# Patient Record
Sex: Male | Born: 1955 | Race: Black or African American | Hispanic: No | Smoking: Current every day smoker
Health system: Southern US, Community
[De-identification: ages and names within clinical notes are randomized; demographics above are authoritative.]

## PROBLEM LIST (undated history)

## (undated) DIAGNOSIS — I639 Cerebral infarction, unspecified: Secondary | ICD-10-CM

---

## 2001-08-05 ENCOUNTER — Emergency Department (HOSPITAL_COMMUNITY): Admission: EM | Admit: 2001-08-05 | Discharge: 2001-08-05 | Payer: Self-pay | Admitting: Emergency Medicine

## 2001-08-17 ENCOUNTER — Emergency Department (HOSPITAL_COMMUNITY): Admission: EM | Admit: 2001-08-17 | Discharge: 2001-08-17 | Payer: Self-pay | Admitting: Emergency Medicine

## 2005-01-13 ENCOUNTER — Emergency Department (HOSPITAL_COMMUNITY): Admission: EM | Admit: 2005-01-13 | Discharge: 2005-01-13 | Payer: Self-pay | Admitting: Emergency Medicine

## 2005-01-15 ENCOUNTER — Emergency Department (HOSPITAL_COMMUNITY): Admission: EM | Admit: 2005-01-15 | Discharge: 2005-01-15 | Payer: Self-pay | Admitting: Family Medicine

## 2010-11-10 ENCOUNTER — Inpatient Hospital Stay (HOSPITAL_COMMUNITY)
Admission: EM | Admit: 2010-11-10 | Discharge: 2010-11-19 | DRG: 066 | Disposition: A | Discharge status: Skilled Nursing Facility | Payer: MEDICAID | Attending: Family Medicine | Admitting: Family Medicine

## 2010-11-10 ENCOUNTER — Emergency Department (HOSPITAL_COMMUNITY): Payer: Self-pay

## 2010-11-10 DIAGNOSIS — M542 Cervicalgia: Secondary | ICD-10-CM | POA: Diagnosis present

## 2010-11-10 DIAGNOSIS — I6350 Cerebral infarction due to unspecified occlusion or stenosis of unspecified cerebral artery: Principal | ICD-10-CM | POA: Diagnosis present

## 2010-11-10 DIAGNOSIS — F17200 Nicotine dependence, unspecified, uncomplicated: Secondary | ICD-10-CM | POA: Diagnosis present

## 2010-11-10 LAB — COMPREHENSIVE METABOLIC PANEL
ALT: 46 U/L (ref 0–53)
AST: 31 U/L (ref 0–37)
Albumin: 3.5 g/dL (ref 3.5–5.2)
Alkaline Phosphatase: 101 U/L (ref 39–117)
BUN: 8 mg/dL (ref 6–23)
Potassium: 3.5 mEq/L (ref 3.5–5.1)
Sodium: 138 mEq/L (ref 135–145)
Total Protein: 6.6 g/dL (ref 6.0–8.3)

## 2010-11-10 LAB — CBC
HCT: 43.5 % (ref 39.0–52.0)
Hemoglobin: 15.3 g/dL (ref 13.0–17.0)
MCH: 32.3 pg (ref 26.0–34.0)
MCHC: 35.2 g/dL (ref 30.0–36.0)
RDW: 13.5 % (ref 11.5–15.5)

## 2010-11-10 LAB — RAPID URINE DRUG SCREEN, HOSP PERFORMED
Cocaine: NOT DETECTED
Opiates: NOT DETECTED
Tetrahydrocannabinol: NOT DETECTED

## 2010-11-10 LAB — URINALYSIS, ROUTINE W REFLEX MICROSCOPIC
Glucose, UA: NEGATIVE mg/dL
Hgb urine dipstick: NEGATIVE
Ketones, ur: 40 mg/dL — AB
pH: 6 (ref 5.0–8.0)

## 2010-11-10 LAB — DIFFERENTIAL
Eosinophils Relative: 5 % (ref 0–5)
Lymphocytes Relative: 28 % (ref 12–46)
Monocytes Absolute: 0.9 10*3/uL (ref 0.1–1.0)
Monocytes Relative: 12 % (ref 3–12)
Neutro Abs: 3.9 10*3/uL (ref 1.7–7.7)

## 2010-11-10 LAB — TROPONIN I: Troponin I: <0.3 ng/mL (ref <0.30)

## 2010-11-10 LAB — CK TOTAL AND CKMB (NOT AT ARMC)
CK, MB: 1.7 ng/mL (ref 0.3–4.0)
Relative Index: 1.1 (ref 0.0–2.5)

## 2010-11-11 ENCOUNTER — Inpatient Hospital Stay (HOSPITAL_COMMUNITY): Payer: Self-pay

## 2010-11-11 DIAGNOSIS — R42 Dizziness and giddiness: Secondary | ICD-10-CM

## 2010-11-11 DIAGNOSIS — I6350 Cerebral infarction due to unspecified occlusion or stenosis of unspecified cerebral artery: Secondary | ICD-10-CM

## 2010-11-11 LAB — GLUCOSE, CAPILLARY: Glucose-Capillary: 111 mg/dL — ABNORMAL HIGH (ref 70–99)

## 2010-11-11 LAB — LIPID PANEL
Cholesterol: 84 mg/dL (ref 0–200)
Triglycerides: 102 mg/dL (ref <150)
VLDL: 20 mg/dL (ref 0–40)

## 2010-11-11 LAB — HEMOGLOBIN A1C
Hgb A1c MFr Bld: 5.9 % — ABNORMAL HIGH (ref <5.7)
Mean Plasma Glucose: 123 mg/dL — ABNORMAL HIGH (ref <117)

## 2010-11-11 MED ORDER — GADOBENATE DIMEGLUMINE 529 MG/ML IV SOLN
17.0000 mL | Freq: Once | INTRAVENOUS | Status: AC
  Administered 2010-11-11: 17 mL via INTRAVENOUS

## 2010-11-12 DIAGNOSIS — I6330 Cerebral infarction due to thrombosis of unspecified cerebral artery: Secondary | ICD-10-CM

## 2010-11-12 LAB — GLUCOSE, CAPILLARY: Glucose-Capillary: 75 mg/dL (ref 70–99)

## 2010-11-13 DIAGNOSIS — I6789 Other cerebrovascular disease: Secondary | ICD-10-CM

## 2010-11-13 LAB — GLUCOSE, CAPILLARY
Glucose-Capillary: 77 mg/dL (ref 70–99)
Glucose-Capillary: 150 mg/dL — ABNORMAL HIGH (ref 70–99)

## 2010-11-13 LAB — PROTEIN S ACTIVITY: Protein S Activity: 98 % (ref 69–129)

## 2010-11-13 LAB — LUPUS ANTICOAGULANT PANEL: Lupus Anticoagulant: NOT DETECTED

## 2010-11-13 LAB — PROTEIN C, TOTAL: Protein C, Total: 63 % — ABNORMAL LOW (ref 72–160)

## 2010-11-13 LAB — PROTEIN S, TOTAL: Protein S Ag, Total: 107 % (ref 60–150)

## 2010-11-13 LAB — SICKLE CELL SCREEN: Sickle Cell Screen: NEGATIVE

## 2010-11-13 LAB — PROTEIN C ACTIVITY: Protein C Activity: 105 % (ref 75–133)

## 2010-11-13 LAB — SEDIMENTATION RATE: Sed Rate: 4 mm/hr (ref 0–16)

## 2010-11-13 LAB — RPR: RPR Ser Ql: NONREACTIVE

## 2010-11-14 LAB — CBC
HCT: 43.6 % (ref 39.0–52.0)
MCHC: 34.2 g/dL (ref 30.0–36.0)
MCV: 92.2 fL (ref 78.0–100.0)
RDW: 13.1 % (ref 11.5–15.5)

## 2010-11-14 LAB — BASIC METABOLIC PANEL
BUN: 11 mg/dL (ref 6–23)
CO2: 25 mEq/L (ref 19–32)
Calcium: 9.7 mg/dL (ref 8.4–10.5)
GFR calc non Af Amer: >60 mL/min (ref >60)
Glucose, Bld: 81 mg/dL (ref 70–99)

## 2010-11-14 LAB — CARDIOLIPIN ANTIBODIES, IGG, IGM, IGA
Anticardiolipin IgA: 5 APL U/mL — ABNORMAL LOW (ref <22)
Anticardiolipin IgM: 6 MPL U/mL — ABNORMAL LOW (ref <11)

## 2010-11-14 LAB — GLUCOSE, CAPILLARY: Glucose-Capillary: 98 mg/dL (ref 70–99)

## 2010-11-14 LAB — BETA-2-GLYCOPROTEIN I ABS, IGG/M/A: Beta-2-Glycoprotein I IgA: 6 A Units (ref <20)

## 2010-11-15 LAB — GLUCOSE, CAPILLARY
Glucose-Capillary: 107 mg/dL — ABNORMAL HIGH (ref 70–99)
Glucose-Capillary: 108 mg/dL — ABNORMAL HIGH (ref 70–99)

## 2010-11-16 LAB — GLUCOSE, CAPILLARY
Glucose-Capillary: 108 mg/dL — ABNORMAL HIGH (ref 70–99)
Glucose-Capillary: 92 mg/dL (ref 70–99)
Glucose-Capillary: 117 mg/dL — ABNORMAL HIGH (ref 70–99)

## 2010-11-17 LAB — GLUCOSE, CAPILLARY

## 2010-11-17 LAB — PROTHROMBIN GENE MUTATION

## 2010-11-27 NOTE — Consult Note (Signed)
[name], [name]               ACCOUNT NO.:  [id number]  MEDICAL RECORD NO.:  [id number]  LOCATION:  [facility]                         FACILITY:  [facility]  PHYSICIAN:  Dr. Thad Ranger           DATE OF BIRTH:  [birth year]  DATE OF CONSULTATION: DATE OF DISCHARGE:                                CONSULTATION   REASON FOR CONSULTATION:  Stroke.  HISTORY OF PRESENT ILLNESS:  This is a 55 year old male with no past medical history as he sees no medical doctor at the current time.  The patient at present time is going back to school for criminal justice degree.  The patient does admit to smoking use of alcohol and in the past use of illicit drugs.  He states that he went out with his friends on Saturday night in which she did drink a significant amount.  After returning home, he recalls following but does not recall whether or not it was secondary to weakness.  He awoke on Sunday morning on the floor of his living room and noted right-sided weakness of his arm and leg. He states that he dragged himself to the other room where he got comfortable on the couch and fell and slept Sunday away.  He states he did not eat any food during Sunday.  Upon waking on Monday, he thought at that time that he had a hangover.  On Monday when awakening and continued to have right-sided weakness and he thought it is best for him to go to the hospital to get checked out.  Upon admission, the patient was noted to have right-sided weakness and slow thought process.  For that reason, the patient underwent a CT scan which was negative. However, MRI of brain showed strokes in multiple vascular territories bilaterally.  Neurology was consulted to further evaluate the patient and give recommendations per stroke protocol.  PAST MEDICAL HISTORY:  Negative.  MEDICATIONS:  The patient is on no medications at home.  ALLERGIES:  No known drug allergies.  FAMILY HISTORY:  Diabetes and stroke along with myocardial  infarct.  SOCIAL HISTORY:  The patient does smoke, drink and does have history of illicit drug use.  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  REVIEW OF SYSTEMS:  Positive for right-sided weakness, decreased slow mentation and decreased sensation along the plantar surfaces of his feet, most notably in the left.  PHYSICAL EXAMINATION:  VITAL SIGNS:  Blood pressure is 123/79, pulse 75, respiration 18, temperature 97.5. GENERAL:  The patient is alert and oriented x3.  He is very slow in mentation and states he is slower to speak than usual.  He does carry out 2- and 3-step commands without any difficulty. HEENT:  Pupils are equal, round, reactive to light and accommodating, conjugate extraocular movements are intact.  Visual fields grossly intact.  Face does show a left facial droop.  His tongue is midline. Uvula is midline.  He shows no dysarthria or dysphasia, but slow to initiate his verbal output.  Facial sensation V1-V3 is full bilaterally. Shoulder shrug head turn within normal limits. COORDINATION:  Finger-to-nose, heel-to-shin were smooth.  Fine movements smooth. MOTOR:  The patient shows 4/5 strength throughout with the exception of decreased strength along his right shoulder abduction, forward flexion of shoulder decreased strength in his right triceps, decreased strength in his right wrist extension and right grip.  The deep tendon reflexes were 2+ throughout downgoing toes bilaterally.  The patient does show a positive drift with his right upper extremity sensation since the patient is stated to be intact throughout to pinprick and light touch. However, he does state he feels as though there is some decreased sensation,which is normally in the left plantar surface of his foot. PULMONARY:  Clear to auscultation bilaterally. CARDIOVASCULAR:  Normal sinus rhythm. NECK:  Negative for bruits and supple.  LABORATORY STUDIES:  A1c is 5.9.  Sodium 138,  potassium 3.5, chloride 103, CO2 is 25, BUN and creatinine 0.74, glucose 88.  White blood cell count 7.0, platelets 195, hemoglobin 15.3, hematocrit 43.5, triglycerides 102, cholesterol 84, HDL 46 LDL 18.  IMAGING DATA:  MRI of brain shows multiple acute subacute infarcts in the posterior left temporal lobe left occipital lobe, left caudate, left insular cortex, left frontal lobe, left parietal lobe along with right parietal lobe.  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  Carotid Doppler shows no ICA stenosis.  A 2-D echo shows no abnormalities.  ASSESSMENT:  This is a pleasant 55 year old male with sudden onset of right-sided weakness and new acute/subacute strokes seen in multiple vascular territories (most notably in the left PCA, left MCA, and right PCA) on imaging.  At this time, we would recommend further workup for acute infarct that would address some possible etiologies in a patient of this age (without multiple risk factors considered to be fairly young).  PLAN:  1. Hypercoagulable panel, sickle-cell panel. 2. TEE to look for cardiac abnormalities, not seen on 2-D echo3. Continue aspirin. 4. Physical therapy and occupational therapy.  I have discussed these signs with Dr. Thad Ranger.  She agrees to the above- mentioned history.     Adam Morn, [name]   ______________________________ Dr. Thad Ranger    DS/MEDQ  D:  11/12/2010  T:  11/13/2010  Job:  [id number]  Electronically Signed by Adam Morn [name] on 11/17/2010 01:17:59 PM Electronically Signed by Thana Farr MD on 11/27/2010 10:24:49 AM

## 2010-11-27 NOTE — Discharge Summary (Signed)
[name], [name]               ACCOUNT NO.:  [id number]  MEDICAL RECORD NO.:  [id number]  LOCATION:  [facility]                         FACILITY:  [facility]  PHYSICIAN:  Pearlean Brownie, M.D.DATE OF BIRTH:  Jun 11, 1955  DATE OF ADMISSION:  11/10/2010 DATE OF DISCHARGE:  11/19/2010                              DISCHARGE SUMMARY   DISCHARGE DIAGNOSES: 1. Multiple bilateral cerebrovascular accident. 2. Tobacco dependence.  DISCHARGE MEDICATIONS: 1. Aspirin 325 mg p.o. daily. 2. Nicotine patch 21 mg q.24 hours transdermal daily.  LABS AND STUDIES: 1. Hypercoagulability panel negative. 2. A1c 5.9. 3. Lipid profile; cholesterol, LDL 18, HDL 46, triglycerides 102. 4. Sickle screen negative. 5. ESR 4. 6. RPR nonreactive. 7. ANA negative.  RADIOLOGY: 1. CT head on admission negative. 2. MR brain showed multiple acute, subacute infarct involving portions     of the posterior left temporal lobe, left occipital lobe, left     caudate head, left subinsular region of left frontal lobe, left     parietal lobe, and medial aspect of right parietal lobe with     findings most notable in the left frontal parietal lobe.  An MRA     showing left vertebral artery not visualized with indeterminate     cause of occlusion, is possibly related to atherosclerotic type     change, but dissection not excluded and nonvisualization of left     PICA. 3. Echocardiogram showing EF within normal limits and no wall motion     abnormalities.  EF 55-60%. 4. Transesophageal echo on November 13, 2010, showing no vegetations or     evidence of thrombus and normal wall motion. 5. Carotid Dopplers showing no significant carotid artery stenosis     extracranially with patent vertebrals with antegrade flow.  BRIEF HOSPITAL COURSE:  This is a 55 year old man with history of tobacco abuse who presents with multiple acute/subacute infarcts.  1. CVAs.  The patient had no     significant past medical history and was  admitted for right sided weakness and speech difficulty. MRI showed the presence of multiple bilateral CVAs.     This was concerning for source of embolic stroke, however,     none was elucidated.  Only notable risk factor is tobacco abuse.     The patient was started on aspirin daily and his symptoms were     monitored.  He has some 4/5 right upper extremity weakness and mild     gait instability and slowing of speech, however, his motor exam was     otherwise grossly intact.  The patient had some improvement in his     symptoms prior to discharge.  The patient was counseled regarding     smoking cessation and will follow up with neurologist in 1-2     months.  He will be discharged to skilled nursing facility for     inpatient physical and occupational therapy. 2. Tobacco abuse.  The patient was started on nicotine patch and     counseled extensively regarding cessation.  FOLLOWUP APPOINTMENTS:  The patient is to follow up with neurologist in 1-2 months for transcranial dopplers and outpatient bubble study.  DISCHARGE CONDITION:  The patient  was discharged to SNF in stable medical condition.    ______________________________ Lloyd Huger, MD   ______________________________ Pearlean Brownie, M.D.    JK/MEDQ  D:  11/14/2010  T:  11/15/2010  Job:  [id number]  Electronically Signed by Lloyd Huger MD on 11/19/2010 03:02:16 PM Electronically Signed by Pearlean Brownie M.D. on 11/27/2010 10:23:13 AM
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
## 2011-05-03 ENCOUNTER — Emergency Department (HOSPITAL_COMMUNITY)
Admission: EM | Admit: 2011-05-03 | Discharge: 2011-05-03 | Disposition: A | Discharge status: Home / Self Care | Payer: Self-pay | Attending: Emergency Medicine | Admitting: Emergency Medicine

## 2011-05-03 ENCOUNTER — Encounter: Payer: Self-pay | Admitting: *Deleted

## 2011-05-03 DIAGNOSIS — R05 Cough: Secondary | ICD-10-CM | POA: Insufficient documentation

## 2011-05-03 DIAGNOSIS — R059 Cough, unspecified: Secondary | ICD-10-CM | POA: Insufficient documentation

## 2011-05-03 DIAGNOSIS — R5381 Other malaise: Secondary | ICD-10-CM | POA: Insufficient documentation

## 2011-05-03 DIAGNOSIS — J3489 Other specified disorders of nose and nasal sinuses: Secondary | ICD-10-CM | POA: Insufficient documentation

## 2011-05-03 DIAGNOSIS — R220 Localized swelling, mass and lump, head: Secondary | ICD-10-CM | POA: Insufficient documentation

## 2011-05-03 DIAGNOSIS — F17200 Nicotine dependence, unspecified, uncomplicated: Secondary | ICD-10-CM | POA: Insufficient documentation

## 2011-05-03 DIAGNOSIS — H00019 Hordeolum externum unspecified eye, unspecified eyelid: Secondary | ICD-10-CM | POA: Insufficient documentation

## 2011-05-03 DIAGNOSIS — J069 Acute upper respiratory infection, unspecified: Secondary | ICD-10-CM | POA: Insufficient documentation

## 2011-05-03 HISTORY — DX: Cerebral infarction, unspecified: I63.9

## 2011-05-03 MED ORDER — IBUPROFEN 800 MG PO TABS: 800.0000 mg | ORAL_TABLET | Freq: Three times a day (TID) | ORAL | Status: AC | PRN

## 2011-05-03 MED ORDER — ERYTHROMYCIN 5 MG/GM OP OINT: TOPICAL_OINTMENT | OPHTHALMIC | Status: AC

## 2011-05-03 MED ORDER — GUAIFENESIN ER 1200 MG PO TB12: 1.0000 | ORAL_TABLET | Freq: Two times a day (BID) | ORAL | Status: DC

## 2011-05-03 MED ORDER — ACETAMINOPHEN-CODEINE 120-12 MG/5ML PO SOLN: 10.0000 mL | ORAL | Status: AC | PRN

## 2011-05-03 NOTE — ED Notes (Signed)
States he has been taking otc meds for a 'cold' and is feeling 'weird'. Hx of stroke and worried about this. Mae x4 freely. No neuro deficits noted

## 2011-05-03 NOTE — ED Provider Notes (Signed)
History     CSN: [id number] Arrival date & time: 05/03/2011  9:54 AM   First MD Initiated Contact with Patient 05/03/11 1040      Chief Complaint  Patient presents with  . Fatigue    (Consider location/radiation/quality/duration/timing/severity/associated sxs/prior treatment) The history is provided by the patient.  55 y.o. male presents to the emergency department c/o a stye on his L eye, cold ssx and discomfort in his R arm.    Pt states the stye has been present x 2 days.  No drainage or pain at the site, no changes in vision.  Pt c/o cold ssx x 4 days with cough, congestion, rhinorrhea, chills, sinus pressure.  Denies headache, chest pain, shortness of breath, abdominal pain, nausea, vomiting, diarrhea and otalgia.  Unknown if any fever.  Pt treating symptoms with ASA and OTC cough and cold medicine.    Pt also c/o discomfort in his R upper arm.  PMHx CVA in June 2012 with R arm, L leg and speech deficits.  Pt states his only ssx is a cramp like sensation on the lateral aspect of the upper arm.  Pt denies paresthesias, numbness, weakness or loss of function.  Pt denies speech or vision changes, arm or leg weakness.    Past Medical History  Diagnosis Date  . Stroke     History reviewed. No pertinent past surgical history.  History reviewed. No pertinent family history.  History  Substance Use Topics  . Smoking status: Current Everyday Smoker    Types: Cigarettes  . Smokeless tobacco: Not on file  . Alcohol Use:       Review of Systems All pertinent positives and negatives in the history of present illness Allergies  Review of patient's allergies indicates no known allergies.  Home Medications   Current Outpatient Rx  Name Route Sig Dispense Refill  . ASPIRIN 325 MG PO TABS Oral Take 325 mg by mouth daily.      Marland Kitchen OVER THE COUNTER MEDICATION Oral Take 10 mLs by mouth every 4 (four) hours.        BP 139/91  Pulse 100  Temp(Src) 98.8 F (37.1 C) (Oral)  Resp  14  SpO2 96%  Physical Exam  Constitutional: He is oriented to person, place, and time. He appears well-developed and well-nourished.  HENT:  Head: Normocephalic and atraumatic.  Right Ear: Tympanic membrane, external ear and ear canal normal.  Left Ear: Tympanic membrane, external ear and ear canal normal.  Nose: Mucosal edema present.  Mouth/Throat: Uvula is midline, oropharynx is clear and moist and mucous membranes are normal.  Eyes: Conjunctivae and EOM are normal. Pupils are equal, round, and reactive to light. Right eye exhibits no discharge and no exudate. Left eye exhibits hordeolum. Left eye exhibits no discharge and no exudate. No scleral icterus.    Neck: Normal range of motion. Neck supple.  Cardiovascular: Normal rate, regular rhythm, normal heart sounds and intact distal pulses.   Pulmonary/Chest: Effort normal and breath sounds normal. No respiratory distress. He has no wheezes. He has no rales. He exhibits no tenderness.  Abdominal: Soft. Bowel sounds are normal. He exhibits no distension. There is no tenderness.  Musculoskeletal:       Right shoulder: He exhibits normal range of motion, no tenderness, no swelling, no crepitus and normal strength.       Right elbow: He exhibits normal range of motion and no deformity. no tenderness found.       Right wrist: He exhibits  normal range of motion, no tenderness, no crepitus and no deformity.       Arms: Lymphadenopathy:    He has no cervical adenopathy.  Neurological: He is alert and oriented to person, place, and time. He has normal strength and normal reflexes. He displays normal reflexes. No cranial nerve deficit or sensory deficit. He exhibits normal muscle tone. Coordination normal. GCS eye subscore is 4. GCS verbal subscore is 5. GCS motor subscore is 6.  Skin: Skin is warm and dry.  Psychiatric: He has a normal mood and affect. His behavior is normal. Judgment and thought content normal.    ED Course  Procedures  (including critical care time)       The patient has what appears to be an upper respiratory tract infection based on his history of present illness and physical exam.  He also has what appears to be a small stye noted on exam.  The patient be treated for these and advised to return here as needed for any worsening in his condition.  Told to follow up with his Dr. for recheck.   MDM          Carlyle Dolly, PA-C 05/03/11 1200

## 2011-05-03 NOTE — ED Provider Notes (Signed)
Medical screening examination/treatment/procedure(s) were performed by non-physician practitioner and as supervising physician I was immediately available for consultation/collaboration. Devoria Albe, MD, Armando Gang   Ward Givens, MD 05/03/11 [phone]

## 2014-05-02 ENCOUNTER — Emergency Department (HOSPITAL_COMMUNITY)
Admission: EM | Admit: 2014-05-02 | Discharge: 2014-05-02 | Disposition: A | Discharge status: Home / Self Care | Payer: Self-pay | Attending: Emergency Medicine | Admitting: Emergency Medicine

## 2014-05-02 ENCOUNTER — Encounter (HOSPITAL_COMMUNITY): Payer: Self-pay | Admitting: Family Medicine

## 2014-05-02 DIAGNOSIS — Z8673 Personal history of transient ischemic attack (TIA), and cerebral infarction without residual deficits: Secondary | ICD-10-CM | POA: Insufficient documentation

## 2014-05-02 DIAGNOSIS — Z7982 Long term (current) use of aspirin: Secondary | ICD-10-CM | POA: Insufficient documentation

## 2014-05-02 DIAGNOSIS — J029 Acute pharyngitis, unspecified: Secondary | ICD-10-CM | POA: Insufficient documentation

## 2014-05-02 DIAGNOSIS — Z720 Tobacco use: Secondary | ICD-10-CM | POA: Insufficient documentation

## 2014-05-02 LAB — RAPID STREP SCREEN (MED CTR MEBANE ONLY): Streptococcus, Group A Screen (Direct): NEGATIVE

## 2014-05-02 NOTE — ED Provider Notes (Signed)
CSN: [id number]     Arrival date & time 05/02/14  1350 History  This chart was scribed for non-physician practitioner, Montine Circle, PA-C working with Fredia Sorrow, MD by Frederich Balding, ED scribe. This patient was seen in room TR06C/TR06C and the patient's care was started at 2:17 PM.   Chief Complaint  Patient presents with  . Sore Throat   The history is provided by the patient. No language interpreter was used.    HPI Comments: [name] is a 58 y.o. male who presents to the Emergency Department complaining of sore throat that started 3 days ago. Also reports nonproductive cough. States he had a sore throat about 1.5 weeks ago that went away on its own. Pt has used cough drops and taken aspirin with no relief. Denies fever.   Past Medical History  Diagnosis Date  . Stroke    History reviewed. No pertinent past surgical history. History reviewed. No pertinent family history. History  Substance Use Topics  . Smoking status: Current Every Day Smoker    Types: Cigarettes  . Smokeless tobacco: Not on file  . Alcohol Use: Not on file    Review of Systems  Constitutional: Negative for fever.  HENT: Positive for sore throat.   Eyes: Negative for redness.  Respiratory: Positive for cough.   Cardiovascular: Negative for chest pain.  Gastrointestinal: Negative for abdominal distention.  Musculoskeletal: Negative for gait problem.  Skin: Negative for rash.  Neurological: Negative for speech difficulty.  Psychiatric/Behavioral: Negative for confusion.   Allergies  Review of patient's allergies indicates no known allergies.  Home Medications   Prior to Admission medications   Medication Sig Start Date End Date Taking? Authorizing Provider  aspirin 325 MG tablet Take 325 mg by mouth daily.      Historical Provider, MD  Guaifenesin 1200 MG TB12 Take 1 tablet (1,200 mg total) by mouth 2 (two) times daily. 05/03/11   Resa Miner Lawyer, PA-C  OVER THE COUNTER MEDICATION  Take 10 mLs by mouth every 4 (four) hours.      Historical Provider, MD   BP 159/101 mmHg  Pulse 102  Temp(Src) 98 F (36.7 C)  Resp 18  SpO2 98%   Physical Exam  Constitutional: He is oriented to person, place, and time. He appears well-developed. No distress.  HENT:  Head: Normocephalic and atraumatic.  Oropharynx is moderately erythematous. No tonsillar exudates or abscess. Uvula is midline. Airway is intact.  Eyes: Conjunctivae and EOM are normal.  Cardiovascular: Normal rate and regular rhythm.   Pulmonary/Chest: Effort normal. No stridor. No respiratory distress.  Abdominal: He exhibits no distension.  Musculoskeletal: He exhibits no edema.  Neurological: He is alert and oriented to person, place, and time.  Skin: Skin is warm and dry.  Psychiatric: He has a normal mood and affect.  Nursing note and vitals reviewed.   ED Course  Procedures (including critical care time)  DIAGNOSTIC STUDIES: Oxygen Saturation is 98% on RA, normal by my interpretation.    COORDINATION OF CARE: 2:18 PM-Discussed treatment plan which includes rapid strep test with pt at bedside and pt agreed to plan.   Labs Review Results for orders placed or performed during the hospital encounter of 05/02/14  Rapid strep screen   (If patient has fever and/or without cough or runny nose)  Result Value Ref Range   Streptococcus, Group A Screen (Direct) NEGATIVE NEGATIVE   No results found.   Imaging Review No results found.   EKG Interpretation None  MDM   Final diagnoses:  Pharyngitis    Pt afebrile without tonsillar exudate, negative strep. Presents with mild cervical lymphadenopathy, & dysphagia; diagnosis of viral pharyngitis. No abx indicated. DC w symptomatic tx for pain  Pt does not appear dehydrated, but did discuss importance of water rehydration. Presentation non concerning for PTA or infxn spread to soft tissue. No trismus or uvula deviation. Specific return precautions  discussed. Pt able to drink water in ED without difficulty with intact air way. Recommended PCP follow up.   I personally performed the services described in this documentation, which was scribed in my presence. The recorded information has been reviewed and is accurate.  Montine Circle, PA-C 05/02/14 [place], MD 05/03/14 [phone]

## 2014-05-02 NOTE — Discharge Instructions (Signed)

## 2014-05-02 NOTE — ED Notes (Signed)
Per pt sts 3 days of sore throat and cold like symptoms.

## 2014-05-04 LAB — CULTURE, GROUP A STREP

## 2014-11-22 ENCOUNTER — Encounter (HOSPITAL_COMMUNITY): Payer: Self-pay | Admitting: Emergency Medicine

## 2014-11-22 ENCOUNTER — Emergency Department (HOSPITAL_COMMUNITY)
Admission: EM | Admit: 2014-11-22 | Discharge: 2014-11-22 | Disposition: A | Discharge status: Home / Self Care | Payer: Self-pay | Attending: Emergency Medicine | Admitting: Emergency Medicine

## 2014-11-22 DIAGNOSIS — M25551 Pain in right hip: Secondary | ICD-10-CM | POA: Insufficient documentation

## 2014-11-22 DIAGNOSIS — Z8673 Personal history of transient ischemic attack (TIA), and cerebral infarction without residual deficits: Secondary | ICD-10-CM | POA: Insufficient documentation

## 2014-11-22 DIAGNOSIS — Z79899 Other long term (current) drug therapy: Secondary | ICD-10-CM | POA: Insufficient documentation

## 2014-11-22 DIAGNOSIS — Z792 Long term (current) use of antibiotics: Secondary | ICD-10-CM | POA: Insufficient documentation

## 2014-11-22 DIAGNOSIS — Z7982 Long term (current) use of aspirin: Secondary | ICD-10-CM | POA: Insufficient documentation

## 2014-11-22 MED ORDER — CYCLOBENZAPRINE HCL 5 MG PO TABS: 5.0000 mg | ORAL_TABLET | Freq: Three times a day (TID) | ORAL | Status: DC | PRN

## 2014-11-22 NOTE — ED Provider Notes (Signed)
CSN: [id number]     Arrival date & time 11/22/14  1850 History  This chart was scribed for non-physician practitioner, Okey Regal, PA-C working with Lacretia Leigh, MD, by Chester Holstein, ED Scribe. This patient was seen in room TR06C/TR06C and the patient's care was started at 7:13 PM.     Chief Complaint  Patient presents with  . Back Pain     The history is provided by the patient. No language interpreter was used.   HPI Comments: [name] is a 59 y.o. male with PMHx of CVA with some left foot and right arm deficiets who presents to the Emergency Department complaining of constant lower right back pain with onset 2 weeks ago. Pt reports 3-4 months ago he hurt his back while attempting to do a split with pain resolution occuring a few days later after several days of rest. He states the pain returned several weeks ago. He states this exacerbation is not as severe as before but is in the same area as before. He notes the pain radiates to upper left leg. He is able to ambulate. He has tried ASA and EtOH for relief. He takes ASA daily. Pt denies h/o IVDA. He has not seen a specialist or PCP for this pain. He denies any new numbness, tingling, loss of strength, bowel or bladder incontinence, difficulty urinating, saddle anesthesia, and fever.  Past Medical History  Diagnosis Date  . Stroke    History reviewed. No pertinent past surgical history. No family history on file. History  Substance Use Topics  . Smoking status: Current Every Day Smoker    Types: Cigarettes  . Smokeless tobacco: Not on file  . Alcohol Use: Yes    Review of Systems  Musculoskeletal: Positive for back pain.  All other systems reviewed and are negative.    Allergies  Review of patient's allergies indicates no known allergies.  Home Medications   Prior to Admission medications   Medication Sig Start Date End Date Taking? Authorizing Provider  aspirin 325 MG tablet Take 325 mg by mouth daily.       Historical Provider, MD  cyclobenzaprine (FLEXERIL) 5 MG tablet Take 1 tablet (5 mg total) by mouth 3 (three) times daily as needed for muscle spasms. 11/22/14   Okey Regal, PA-C  Guaifenesin 1200 MG TB12 Take 1 tablet (1,200 mg total) by mouth 2 (two) times daily. 05/03/11   Christopher Lawyer, PA-C  OVER THE COUNTER MEDICATION Take 10 mLs by mouth every 4 (four) hours.      Historical Provider, MD   BP 150/94 mmHg  Pulse 104  Temp(Src) 97.9 F (36.6 C) (Oral)  Resp 18  Ht 5\' 7"  (1.702 m)  Wt 209 lb 9 oz (95.057 kg)  BMI 32.81 kg/m2  SpO2 96% Physical Exam  Constitutional: He is oriented to person, place, and time. He appears well-developed and well-nourished.  HENT:  Head: Normocephalic.  Eyes: Conjunctivae are normal.  Neck: Normal range of motion. Neck supple.  Pulmonary/Chest: Effort normal.  Musculoskeletal: Normal range of motion.       Cervical back: He exhibits no bony tenderness.       Thoracic back: He exhibits no bony tenderness.       Lumbar back: He exhibits no bony tenderness.  Tenderness to superior aspect of gluteus to the right No deformities or step-offs noted Positive pain with flexion and internal rotation of right femur. Straight leg negative. Ambulates without difficulty  Neurological: He is alert and oriented  to person, place, and time.  Distal sensation grossly intact, perfusion intact, strength 5 out of 5 in all major muscle groups.  Skin: Skin is warm and dry.  Psychiatric: He has a normal mood and affect. His behavior is normal.  Nursing note and vitals reviewed.   ED Course  Procedures (including critical care time) DIAGNOSTIC STUDIES: Oxygen Saturation is 96% on room air, normal by my interpretation.    COORDINATION OF CARE: 7:44 PM Discussed treatment plan with patient at beside, the patient agrees with the plan and has no further questions at this time.   Labs Review Labs Reviewed - No data to display  ImagPatient presents withing  Review No results found.   EKG Interpretation None      MDM   Final diagnoses:  Hip pain, right   Patient presents with likely hip pain after attempting to do the splits. Patient reports symptoms resolved, but have been reoccurring. Patient reports he is able to ambulate, with some pain. Patient denies any distal neurological, strength, profusion deficits. No red flags for back pain. Patient was given contact information for case management, and [place] wellness to follow-up for access to insurance. Patient was given symptomatic treatment, follow-up information if symptoms continue to persist. Strict return precautions given. No further questions or concerns at time of discharge.  I personally performed the services described in this documentation, which was scribed in my presence. The recorded information has been reviewed and is accurate.    Okey Regal, PA-C 11/23/14 [place], MD 11/26/14 1044

## 2014-11-22 NOTE — ED Notes (Signed)
Pt st's he injured his lower back dancing approx 2 months ago.  St's after a few days of rest it was better.  Pt st's lower back started hurting again 2 weeks ago and will not go away

## 2014-11-22 NOTE — Discharge Instructions (Signed)
Please contact Mariann Laster at [phone] for help with insurance and appointment at [facility] and wellness. Monitor for new or worsening signs or symptoms return immediately if any present.

## 2014-12-21 ENCOUNTER — Ambulatory Visit: Payer: Self-pay

## 2016-01-02 ENCOUNTER — Emergency Department (HOSPITAL_COMMUNITY)
Admission: EM | Admit: 2016-01-02 | Discharge: 2016-01-02 | Disposition: A | Discharge status: Home / Self Care | Payer: Self-pay | Attending: Emergency Medicine | Admitting: Emergency Medicine

## 2016-01-02 ENCOUNTER — Encounter (HOSPITAL_COMMUNITY): Payer: Self-pay | Admitting: Emergency Medicine

## 2016-01-02 DIAGNOSIS — E860 Dehydration: Secondary | ICD-10-CM

## 2016-01-02 DIAGNOSIS — Z79899 Other long term (current) drug therapy: Secondary | ICD-10-CM | POA: Insufficient documentation

## 2016-01-02 DIAGNOSIS — E882 Lipomatosis, not elsewhere classified: Secondary | ICD-10-CM | POA: Insufficient documentation

## 2016-01-02 DIAGNOSIS — F17210 Nicotine dependence, cigarettes, uncomplicated: Secondary | ICD-10-CM | POA: Insufficient documentation

## 2016-01-02 DIAGNOSIS — Z8673 Personal history of transient ischemic attack (TIA), and cerebral infarction without residual deficits: Secondary | ICD-10-CM | POA: Insufficient documentation

## 2016-01-02 DIAGNOSIS — Z7982 Long term (current) use of aspirin: Secondary | ICD-10-CM | POA: Insufficient documentation

## 2016-01-02 DIAGNOSIS — D179 Benign lipomatous neoplasm, unspecified: Secondary | ICD-10-CM

## 2016-01-02 DIAGNOSIS — R55 Syncope and collapse: Secondary | ICD-10-CM

## 2016-01-02 DIAGNOSIS — I10 Essential (primary) hypertension: Secondary | ICD-10-CM

## 2016-01-02 LAB — BASIC METABOLIC PANEL
Anion gap: 9 (ref 5–15)
BUN: 9 mg/dL (ref 6–20)
CALCIUM: 8.5 mg/dL — AB (ref 8.9–10.3)
CO2: 26 mmol/L (ref 22–32)
CREATININE: 0.85 mg/dL (ref 0.61–1.24)
Chloride: 106 mmol/L (ref 101–111)
Glucose, Bld: 110 mg/dL — ABNORMAL HIGH (ref 65–99)
POTASSIUM: 3.6 mmol/L (ref 3.5–5.1)
SODIUM: 141 mmol/L (ref 135–145)

## 2016-01-02 LAB — CBC
HCT: 46.3 % (ref 39.0–52.0)
HEMOGLOBIN: 15.7 g/dL (ref 13.0–17.0)
MCH: 32.7 pg (ref 26.0–34.0)
MCHC: 33.9 g/dL (ref 30.0–36.0)
MCV: 96.5 fL (ref 78.0–100.0)
PLATELETS: 202 10*3/uL (ref 150–400)
RBC: 4.8 MIL/uL (ref 4.22–5.81)
RDW: 13.7 % (ref 11.5–15.5)
WBC: 4.6 10*3/uL (ref 4.0–10.5)

## 2016-01-02 LAB — I-STAT TROPONIN, ED: TROPONIN I, POC: 0.01 ng/mL (ref 0.00–0.08)

## 2016-01-02 MED ORDER — HYDROCHLOROTHIAZIDE 12.5 MG PO TABS: 12.5000 mg | ORAL_TABLET | Freq: Every day | ORAL | 1 refills | Status: DC

## 2016-01-02 MED ORDER — SODIUM CHLORIDE 0.9 % IV BOLUS (SEPSIS)
1000.0000 mL | Freq: Once | INTRAVENOUS | Status: AC
  Administered 2016-01-02: 1000 mL via INTRAVENOUS

## 2016-01-02 MED ORDER — ONDANSETRON HCL 4 MG/2ML IJ SOLN
4.0000 mg | Freq: Once | INTRAMUSCULAR | Status: AC
Start: 2016-01-02 — End: 2016-01-02
  Administered 2016-01-02: 4 mg via INTRAVENOUS
  Filled 2016-01-02: qty 2

## 2016-01-02 NOTE — ED Notes (Signed)
Pt d/c home via wheelchair.

## 2016-01-02 NOTE — ED Provider Notes (Signed)
ote   CSN: [id number] Arrival date & time: 01/02/16  1129  First Provider Contact:  First MD Initiated Contact with Patient 01/02/16 1131        History   Chief Complaint Chief Complaint  Patient presents with  . Weakness    HPI  Blood pressure (!) 163/102, pulse 87, temperature 97.9 F (36.6 C), temperature source Oral, resp. rate 16, height 5\' 7"  (1.702 m), weight 90.7 kg, SpO2 98 %.  [name] is a 60 y.o. male complaining of lightheaded sensation when going from sitting to standing with no actual syncope this morning. Patient states that he was drinking heavily last night, states that he drinks heavily on the weekends, states that he was drinking to deal with the fact that he is homeless, he states he had a stroke in 2012 was not followed since then, he is not taking any blood pressure medication. He denies active chest pain, shortness of breath, nausea, vomiting, change in bowel or bladder habits. States he has a growth on the bottom of the left foot and on the posterior lateral right neck. An eyes any suicidal ideation, homicidal ideation, auditory or visual hallucinations, regular alcohol use with any alcohol withdrawal symptoms, drug use. States he drinks only on the weekends. States he is trying to apply for disability for chronic low back pain and left knee pain.  HPI  Past Medical History:  Diagnosis Date  . Stroke [facility])     There are no active problems to display for this patient.   History reviewed. No pertinent surgical history.     Home Medications    Prior to Admission medications   Medication Sig Start Date End Date Taking? Authorizing Provider  aspirin 325 MG tablet Take 325 mg by mouth daily.      Historical Provider, MD  cyclobenzaprine (FLEXERIL) 5 MG tablet Take 1 tablet (5 mg total) by mouth 3 (three) times daily as needed for muscle spasms. 11/22/14   Okey Regal, PA-C  Guaifenesin 1200 MG TB12 Take 1 tablet (1,200 mg total) by mouth 2  (two) times daily. 05/03/11   Christopher Lawyer, PA-C  OVER THE COUNTER MEDICATION Take 10 mLs by mouth every 4 (four) hours.      Historical Provider, MD    Family History No family history on file.  Social History Social History  Substance Use Topics  . Smoking status: Current Every Day Smoker    Types: Cigarettes  . Smokeless tobacco: Never Used  . Alcohol use Yes     Allergies   Review of patient's allergies indicates no known allergies.   Review of Systems Review of Systems  10 systems reviewed and found to be negative, except as noted in the HPI.  Physical Exam Updated Vital Signs BP (!) 163/102 (BP Location: Right Arm)   Pulse 87   Temp 97.9 F (36.6 C) (Oral)   Resp 16   Ht 5\' 7"  (1.702 m)   Wt 90.7 kg   SpO2 98%   BMI 31.32 kg/m   Physical Exam  Constitutional: He is oriented to person, place, and time. He appears well-developed and well-nourished. No distress.  HENT:  Head: Normocephalic and atraumatic.  Mouth/Throat: Oropharynx is clear and moist.  Eyes: Conjunctivae and EOM are normal. Pupils are equal, round, and reactive to light.  Neck: Normal range of motion.    Cardiovascular: Normal rate, regular rhythm and intact distal pulses.   Pulmonary/Chest: Effort normal and breath sounds normal.  Abdominal: Soft.  There is no tenderness.  Musculoskeletal: Normal range of motion. He exhibits tenderness.       Feet:  Neurological: He is alert and oriented to person, place, and time.  Skin: He is not diaphoretic.  Psychiatric: He has a normal mood and affect.  Nursing note and vitals reviewed.    ED Treatments / Results  Labs (all labs ordered are listed, but only abnormal results are displayed) Labs Reviewed  BASIC METABOLIC PANEL - Abnormal; Notable for the following:       Result Value   Glucose, Bld 110 (*)    Calcium 8.5 (*)    All other components within normal limits  CBC  URINALYSIS, ROUTINE W REFLEX MICROSCOPIC (NOT AT [facility])     EKG  EKG Interpretation  Date/Time:  Thursday January 02 2016 11:34:51 EDT Ventricular Rate:  86 PR Interval:    QRS Duration: 85 QT Interval:  384 QTC Calculation: 460 R Axis:   2 Text Interpretation:  Sinus rhythm Probable left atrial enlargement Non-specific ST-t changes unchanged from prior No significant change since last tracing Confirmed by ISAACS MD, Lysbeth Galas [phone]) on 01/02/2016 11:53:19 AM       Radiology No results found.  Procedures Procedures (including critical care time)  Medications Ordered in ED Medications - No data to display   Initial Impression / Assessment and Plan / ED Course  I have reviewed the triage vital signs and the nursing notes.  Pertinent labs & imaging results that were available during my care of the patient were reviewed by me and considered in my medical decision making (see chart for details).  Clinical Course  Value Comment By Time  EKG 12-Lead (Reviewed) Duffy Bruce, MD 08/10 1152    Vitals:   01/02/16 1132 01/02/16 1140  BP:  (!) 163/102  Pulse:  87  Resp:  16  Temp:  97.9 F (36.6 C)  TempSrc:  Oral  SpO2: 97% 98%  Weight:  90.7 kg  Height:  5\' 7"  (1.702 m)    Medications  sodium chloride 0.9 % bolus 1,000 mL (not administered)  ondansetron (ZOFRAN) injection 4 mg (not administered)    [name] is 60 y.o. male presenting with Lightheaded sensation, he was drinking heavily last night, no associated chest pain or shortness of breath. Patient's blood pressure is elevated at 163/102, he has a history of stroke in 2012. He is not insured and does not have primary care follow-up. Case management consult appreciated: They have an appointment for him at the [facility] tomorrow so he can apply for the orange card. Other than elevated blood pressure vital signs are within normal limits, will do basic cardiac workup. He is concerned about what appears to be lipomas on the neck and sole of left foot.   EKG with no acute  changes, troponin negative, will start him on hydrochlorothiazide for blood pressure. Blood work reassuring with no electrolyte abnormality. Resource guide given.  Troponin negative. Blood work reassuring. Patient will be started on hydrochlorothiazide.  This is a shared visit with the attending physician who personally evaluated the patient and agrees with the care plan.    Evaluation does not show pathology that would require ongoing emergent intervention or inpatient treatment. Pt is hemodynamically stable and mentating appropriately. Discussed findings and plan with patient/guardian, who agrees with care plan. All questions answered. Return precautions discussed and outpatient follow up given.      Final Clinical Impressions(s) / ED Diagnoses   Final diagnoses:  Near  syncope  Dehydration  Hypertension, uncontrolled  Lipoma    New Prescriptions New Prescriptions   HYDROCHLOROTHIAZIDE (HYDRODIURIL) 12.5 MG TABLET    Take 1 tablet (12.5 mg total) by mouth daily.     Monico Blitz, PA-C 01/02/16 1606    Duffy Bruce, MD 01/02/16 [phone]

## 2016-01-02 NOTE — ED Triage Notes (Signed)
Per EMS pt homeless but is staying with friend yesterday. C/o increased generalized weakness more pronounced in right arm and left leg. Patient is ambulatory- but endorses dizziness upon standing but has steady gait. Patient also endorses right flank pain, headache. Patient had also been binge drinking for the past 4 days. Patient has chronic mass on the back of neck that is tender to palpation.

## 2016-01-02 NOTE — ED Notes (Signed)
Doctor at bedside.

## 2016-01-02 NOTE — Progress Notes (Signed)
Spoke to patient regarding establishing primary care and the [facility] orange card. Patient was informed to meet me at the [facility] 01/03/16 @ 8:00 to obtain the orange card and to establish primary care with the provided on site. Pt verbalized understanding. My contact information provided for any future questions or concerns. No other [facility] Specialist needs identified at this time.   [place] Specialist [facility] [phone]

## 2016-01-02 NOTE — Discharge Instructions (Signed)
Do not hesitate to return to the emergency room for any new, worsening or concerning symptoms. ° °Please obtain primary care using resource guide below. Let them know that you were seen in the emergency room and that they will need to obtain records for further outpatient management. ° ° °

## 2016-02-28 ENCOUNTER — Encounter: Payer: Self-pay | Admitting: Pediatric Intensive Care

## 2016-03-02 ENCOUNTER — Ambulatory Visit: Payer: Self-pay | Attending: Internal Medicine | Admitting: Sports Medicine

## 2016-03-02 DIAGNOSIS — M79672 Pain in left foot: Secondary | ICD-10-CM

## 2016-03-02 DIAGNOSIS — M722 Plantar fascial fibromatosis: Secondary | ICD-10-CM

## 2016-03-02 NOTE — Progress Notes (Signed)
Subjective: [name] is a 60 y.o. male patient who presents to office for evaluation of left foot pain and lump in arch. Patient states that lump has been present 4-5 month and has not changed in size. Pain is only present with barefoot. Patient denies any other pedal complaints. Denies injury/trip/fall/sprain/any causative factors.   There are no active problems to display for this patient.   Current Outpatient Prescriptions on File Prior to Visit  Medication Sig Dispense Refill  . aspirin 325 MG tablet Take 325 mg by mouth daily.      . cyclobenzaprine (FLEXERIL) 5 MG tablet Take 1 tablet (5 mg total) by mouth 3 (three) times daily as needed for muscle spasms. 30 tablet 0  . Guaifenesin 1200 MG TB12 Take 1 tablet (1,200 mg total) by mouth 2 (two) times daily. 20 each 0  . hydrochlorothiazide (HYDRODIURIL) 12.5 MG tablet Take 1 tablet (12.5 mg total) by mouth daily. 30 tablet 1  . OVER THE COUNTER MEDICATION Take 10 mLs by mouth every 4 (four) hours.       No current facility-administered medications on file prior to visit.     No Known Allergies  Objective:  General: Alert and oriented x3 in no acute distress  Dermatology: No open lesions bilateral lower extremities, no webspace macerations, no ecchymosis bilateral, all nails x 10 are well manicured. Focal palpable soft tissue mass left arch raised and measures 5x4cm approx. nonpulsatile no acute infection  Vascular: Dorsalis Pedis and Posterior Tibial pedal pulses palpable, Capillary Fill Time 3 seconds,(+) pedal hair growth bilateral, no edema bilateral lower extremities, Temperature gradient within normal limits.  Neurology: Gross sensation intact via light touch bilateral, (- )Tinels sign bilateral.   Musculoskeletal: Minimal tenderness with palpation at left arch, Strength within normal limits in all groups bilateral.   Assessment and Plan: Problem List Items Addressed This Visit    None    Visit Diagnoses    Plantar  fibromatosis    -  Primary   Relevant Orders   DG Foot Complete Left   Left foot pain       Relevant Orders   DG Foot Complete Left      -Complete examination performed -Discussed treatement options for likely fibroma vs lipoma -Rx xrays for further review if negative recommend MRI -Advised close monitoring and padding area -Patient to return to clinic after xrays or sooner if condition worsens.  Landis Martins, DPM

## 2016-04-01 NOTE — Congregational Nurse Program (Signed)
Congregational Nurse Program Note  Date of Encounter: 02/28/2016  Past Medical History: Past Medical History:  Diagnosis Date  . Stroke [facility])     Encounter Details:     CNP Questionnaire - 03/24/16 0900      Patient Demographics   Is this a new or existing patient? New   Patient is considered a/an Not Applicable   Race African-American/Black     Patient Assistance   Location of Patient Assistance [place]   Patient's financial/insurance status [facility] Oncologist   Uninsured Patient ([place]) Yes   Interventions Appt. has been completed   Patient referred to apply for the following financial assistance Not Applicable   Food insecurities addressed Not Applicable   Transportation assistance No   Assistance securing medications No   Educational health offerings Hypertension     Encounter Details   Primary purpose of visit Chronic Illness/Condition Visit   Was an Emergency Department visit averted? Not Applicable   Does patient have a medical provider? Yes   Patient referred to Follow up with established PCP   Was a mental health screening completed? (GAINS tool) No   Does patient have dental issues? No   Does patient have vision issues? No   Does your patient have an abnormal blood pressure today? No   Since previous encounter, have you referred patient for abnormal blood pressure that resulted in a new diagnosis or medication change? No   Does your patient have an abnormal blood glucose today? No   Since previous encounter, have you referred patient for abnormal blood glucose that resulted in a new diagnosis or medication change? No   Was there a life-saving intervention made? No      Client in for BP check. Has established PCP. Follow up with PCP as needed, CN clinic as needed.

## 2021-02-18 ENCOUNTER — Encounter: Payer: Self-pay | Admitting: Family Medicine

## 2021-02-18 ENCOUNTER — Other Ambulatory Visit: Payer: Self-pay

## 2021-02-18 ENCOUNTER — Ambulatory Visit (INDEPENDENT_AMBULATORY_CARE_PROVIDER_SITE_OTHER): Payer: Medicare Other | Admitting: Family Medicine

## 2021-02-18 VITALS — BP 152/94 | HR 84 | Ht 67.0 in | Wt 208.0 lb

## 2021-02-18 DIAGNOSIS — F1090 Alcohol use, unspecified, uncomplicated: Secondary | ICD-10-CM

## 2021-02-18 DIAGNOSIS — G8929 Other chronic pain: Secondary | ICD-10-CM | POA: Diagnosis not present

## 2021-02-18 DIAGNOSIS — Z23 Encounter for immunization: Secondary | ICD-10-CM | POA: Diagnosis not present

## 2021-02-18 DIAGNOSIS — Z131 Encounter for screening for diabetes mellitus: Secondary | ICD-10-CM

## 2021-02-18 DIAGNOSIS — R7309 Other abnormal glucose: Secondary | ICD-10-CM | POA: Diagnosis not present

## 2021-02-18 DIAGNOSIS — Z114 Encounter for screening for human immunodeficiency virus [HIV]: Secondary | ICD-10-CM

## 2021-02-18 DIAGNOSIS — M545 Low back pain, unspecified: Secondary | ICD-10-CM

## 2021-02-18 DIAGNOSIS — I10 Essential (primary) hypertension: Secondary | ICD-10-CM | POA: Diagnosis present

## 2021-02-18 DIAGNOSIS — Z1159 Encounter for screening for other viral diseases: Secondary | ICD-10-CM | POA: Diagnosis not present

## 2021-02-18 DIAGNOSIS — I639 Cerebral infarction, unspecified: Secondary | ICD-10-CM

## 2021-02-18 DIAGNOSIS — Z7289 Other problems related to lifestyle: Secondary | ICD-10-CM

## 2021-02-18 DIAGNOSIS — D170 Benign lipomatous neoplasm of skin and subcutaneous tissue of head, face and neck: Secondary | ICD-10-CM

## 2021-02-18 DIAGNOSIS — Z0000 Encounter for general adult medical examination without abnormal findings: Secondary | ICD-10-CM

## 2021-02-18 DIAGNOSIS — Z13220 Encounter for screening for lipoid disorders: Secondary | ICD-10-CM

## 2021-02-18 MED ORDER — LOSARTAN POTASSIUM 25 MG PO TABS: 25.0000 mg | ORAL_TABLET | Freq: Every day | ORAL | 0 refills | Status: DC

## 2021-02-18 NOTE — Progress Notes (Signed)
Subjective:   CC: Establish care, HTN  HPI:  [name] is a 65 y.o. male who presents today to establish care. Previously seen by a nurse at the [facility] but has not seen a doctor in several years.   Initial concerns:  HTN- has been off medication for at least several months. Would like refills today. Previously took Losartan. Also took HCTZ at one point many years ago. Can't take ACE inhibitors due to cough. Low back pain- present for several years but has been worse over last 2 years. Mostly right sided but also midline. Reports he was supposed to have imaging to determine cause but then COVID hit. Taking Ibuprofen 3 times daily which helps slightly. States he spends most of his time in bed because of the pain. No red flag sx including bowel/bladder incontinence, weight loss, fever, hx of cancer or IVDU. Lump on neck- present 10 years, bothersome. Previously told it was benign.  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 normal heart sounds, no murmurs Respiratory: CTAB, normal effort, No wheezes, rales or rhonchi Abdomen: non-tender, non-distended Neck: supple   Back: no deformity or step-offs noted, no midline tenderness in thoracic spine. Mild midline tenderness in lumbar region, mild right paraspinal muscle tenderness in lumbar area. Pain with flexion but ROM is normal. Normal extension and rotation. Normal  gait. Extremities: no edema or cyanosis Skin: warm and dry, no rashes noted Neuro: alert, no obvious focal deficits Psych: Normal affect and mood  Assessment & Plan:   Essential hypertension BP elevated to 152/94 today. This is not surprising as he has known hx of HTN and has been off medications for quite some time. -Start Losartan 25mg  daily -Check CMP today -Follow up in 1 month  Chronic low back pain Chronic midline and right sided lumbar pain, worse over the last 2 years. Exam is largely benign and no red flag symptoms on history. No prior imaging. 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  -Obtain lumbar x-rays -Referral to physical therapy  Lipoma of neck Large soft tissue mass on posterior neck, present >10 years. Soft, moveable, nontender on exam-- most likely etiology is lipoma. -Referral to gen surg  Alcohol intake above recommended sensible limits Patient reports he drinks "1/2 gallon of vodka Fri-Sun". No known hx of withdrawal.  -check CMP today -continue to address at future visits  CVA (cerebral vascular accident) [facility]) Currently on ASA 325mg  daily. -Start Atorvastatin 20mg  daily. Plan to increase to 40mg  in the future as tolerated.  Health Maintenance Patient has not seen a doctor in many years. Will obtain basic screening items today including lipid panel, A1c, HIV, and Hep C. Flu shot given today.  -Due for colonoscopy (never done). Reports his sister was reimbursed for CRC screening through some program so he will look into  the details -Qualifies for AAA screening and low dose lung CT due to tobacco use. Will need separate appt to discuss   Alcus Dad, MD [place] PGY-2

## 2021-02-18 NOTE — Patient Instructions (Addendum)
It was great to meet you!  -We are checking several labs today. I will call you with the results. -We will restart Losartan 25mg  daily for your blood pressure. -I have ordered an x-ray of your back. You can go to [facility] during regular business hours to have this done. No appointment needed -I have placed a referral to physical therapy for your back. Someone should call you to schedule an appointment. -For the growth on your neck, I have referred you to general surgery. Someone will call you to arrange an appointment.  -See me back in 1 month   Take care and seek immediate care sooner if you develop any concerns.  Dr. [facility]

## 2021-02-19 DIAGNOSIS — M545 Low back pain, unspecified: Secondary | ICD-10-CM | POA: Insufficient documentation

## 2021-02-19 DIAGNOSIS — I639 Cerebral infarction, unspecified: Secondary | ICD-10-CM | POA: Insufficient documentation

## 2021-02-19 DIAGNOSIS — G8929 Other chronic pain: Secondary | ICD-10-CM | POA: Insufficient documentation

## 2021-02-19 DIAGNOSIS — F1090 Alcohol use, unspecified, uncomplicated: Secondary | ICD-10-CM | POA: Insufficient documentation

## 2021-02-19 DIAGNOSIS — Z7289 Other problems related to lifestyle: Secondary | ICD-10-CM | POA: Insufficient documentation

## 2021-02-19 DIAGNOSIS — I10 Essential (primary) hypertension: Secondary | ICD-10-CM | POA: Insufficient documentation

## 2021-02-19 DIAGNOSIS — D170 Benign lipomatous neoplasm of skin and subcutaneous tissue of head, face and neck: Secondary | ICD-10-CM | POA: Insufficient documentation

## 2021-02-19 LAB — HEMOGLOBIN A1C
Est. average glucose Bld gHb Est-mCnc: 126 mg/dL
Hgb A1c MFr Bld: 6 % — ABNORMAL HIGH (ref 4.8–5.6)

## 2021-02-19 LAB — LIPID PANEL
Chol/HDL Ratio: 2.4 ratio (ref 0.0–5.0)
Cholesterol, Total: 102 mg/dL (ref 100–199)
HDL: 42 mg/dL (ref >39)
LDL Chol Calc (NIH): 39 mg/dL (ref 0–99)
Triglycerides: 116 mg/dL (ref 0–149)
VLDL Cholesterol Cal: 21 mg/dL (ref 5–40)

## 2021-02-19 LAB — COMPREHENSIVE METABOLIC PANEL
ALT: 37 IU/L (ref 0–44)
AST: 17 IU/L (ref 0–40)
Albumin/Globulin Ratio: 1.2 (ref 1.2–2.2)
Albumin: 4.3 g/dL (ref 3.8–4.8)
Alkaline Phosphatase: 108 IU/L (ref 44–121)
BUN/Creatinine Ratio: 14 (ref 10–24)
BUN: 16 mg/dL (ref 8–27)
Bilirubin Total: 0.4 mg/dL (ref 0.0–1.2)
CO2: 17 mmol/L — ABNORMAL LOW (ref 20–29)
Calcium: 9.7 mg/dL (ref 8.6–10.2)
Chloride: 107 mmol/L — ABNORMAL HIGH (ref 96–106)
Creatinine, Ser: 1.11 mg/dL (ref 0.76–1.27)
Globulin, Total: 3.5 g/dL (ref 1.5–4.5)
Glucose: 113 mg/dL — ABNORMAL HIGH (ref 70–99)
Potassium: 3.7 mmol/L (ref 3.5–5.2)
Sodium: 142 mmol/L (ref 134–144)
Total Protein: 7.8 g/dL (ref 6.0–8.5)
eGFR: 74 mL/min/{1.73_m2} (ref >59)

## 2021-02-19 LAB — HCV INTERPRETATION

## 2021-02-19 LAB — HCV AB W REFLEX TO QUANT PCR: HCV Ab: <0.1 s/co ratio (ref 0.0–0.9)

## 2021-02-19 LAB — HIV ANTIBODY (ROUTINE TESTING W REFLEX): HIV Screen 4th Generation wRfx: NONREACTIVE

## 2021-02-19 MED ORDER — ATORVASTATIN CALCIUM 20 MG PO TABS
20.0000 mg | ORAL_TABLET | Freq: Every day | ORAL | 0 refills | Status: DC
Start: 2021-02-19 — End: 2021-08-14

## 2021-02-19 NOTE — Assessment & Plan Note (Signed)
Large soft tissue mass on posterior neck, present >10 years. Soft, moveable, nontender on exam-- most likely etiology is lipoma. -Referral to gen surg

## 2021-02-19 NOTE — Assessment & Plan Note (Signed)
BP elevated to 152/94 today. This is not surprising as he has known hx of HTN and has been off medications for quite some time. -Start Losartan 25mg  daily -Check CMP today -Follow up in 1 month

## 2021-02-19 NOTE — Assessment & Plan Note (Addendum)
Currently on ASA 325mg  daily. -Start Atorvastatin 20mg  daily. Plan to increase to 40mg  in the future as tolerated.

## 2021-02-19 NOTE — Assessment & Plan Note (Signed)
Patient reports he drinks "1/2 gallon of vodka Fri-Sun". No known hx of withdrawal.  -check CMP today -continue to address at future visits

## 2021-02-19 NOTE — Assessment & Plan Note (Addendum)
Chronic midline and right sided lumbar pain, worse over the last 2 years. Exam is largely benign and no red flag symptoms on history. No prior imaging. 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  -Obtain lumbar x-rays -Referral to physical therapy

## 2021-02-25 ENCOUNTER — Encounter: Payer: Self-pay | Admitting: Family Medicine

## 2021-02-25 DIAGNOSIS — R7303 Prediabetes: Secondary | ICD-10-CM | POA: Insufficient documentation

## 2021-03-04 ENCOUNTER — Ambulatory Visit: Payer: Medicare Other

## 2021-03-13 ENCOUNTER — Ambulatory Visit: Payer: Medicare Other

## 2021-03-18 ENCOUNTER — Other Ambulatory Visit: Payer: Self-pay | Admitting: General Surgery

## 2021-03-18 DIAGNOSIS — D170 Benign lipomatous neoplasm of skin and subcutaneous tissue of head, face and neck: Secondary | ICD-10-CM

## 2021-03-27 ENCOUNTER — Ambulatory Visit: Payer: Medicare Other | Admitting: Physical Therapy

## 2021-04-03 ENCOUNTER — Ambulatory Visit: Payer: Medicare Other | Admitting: Physical Therapy

## 2021-04-11 ENCOUNTER — Other Ambulatory Visit: Payer: Medicare Other

## 2021-04-11 ENCOUNTER — Inpatient Hospital Stay: Admission: RE | Admit: 2021-04-11 | Payer: Medicare Other | Source: Ambulatory Visit

## 2021-05-02 ENCOUNTER — Ambulatory Visit
Admission: RE | Admit: 2021-05-02 | Discharge: 2021-05-02 | Disposition: A | Discharge status: Home / Self Care | Payer: Medicare Other | Source: Ambulatory Visit | Attending: Family Medicine | Admitting: Family Medicine

## 2021-05-02 ENCOUNTER — Ambulatory Visit
Admission: RE | Admit: 2021-05-02 | Discharge: 2021-05-02 | Disposition: A | Discharge status: Home / Self Care | Payer: Medicare Other | Source: Ambulatory Visit | Attending: General Surgery | Admitting: General Surgery

## 2021-05-02 DIAGNOSIS — D170 Benign lipomatous neoplasm of skin and subcutaneous tissue of head, face and neck: Secondary | ICD-10-CM

## 2021-05-02 DIAGNOSIS — M545 Low back pain, unspecified: Secondary | ICD-10-CM

## 2021-05-02 DIAGNOSIS — G8929 Other chronic pain: Secondary | ICD-10-CM

## 2021-05-02 MED ORDER — IOPAMIDOL (ISOVUE-300) INJECTION 61%
75.0000 mL | Freq: Once | INTRAVENOUS | Status: AC | PRN
  Administered 2021-05-02: 75 mL via INTRAVENOUS

## 2021-05-20 ENCOUNTER — Encounter: Payer: Self-pay | Admitting: Family Medicine

## 2021-08-14 ENCOUNTER — Other Ambulatory Visit: Payer: Self-pay | Admitting: Family Medicine

## 2021-08-14 DIAGNOSIS — I639 Cerebral infarction, unspecified: Secondary | ICD-10-CM

## 2021-08-14 DIAGNOSIS — I10 Essential (primary) hypertension: Secondary | ICD-10-CM

## 2021-11-03 DIAGNOSIS — Z720 Tobacco use: Secondary | ICD-10-CM | POA: Insufficient documentation

## 2021-11-03 NOTE — Progress Notes (Signed)
    SUBJECTIVE:   CHIEF COMPLAINT / HPI:   HTN Patient had been off his medication for ~6 months. Just recently resumed Losartan '25mg'$  daily about 10 days ago. Reports he took it this morning. Does not check BP at home. Denies any headaches, vision changes or other complaints.  Alcohol Use Continues to drink "1/2 a gallon" of vodka every weekend. Denies any history of withdrawal symptoms. Not motivated to reduce intake.  PERTINENT  PMH / PSH: Chronic back pain, h/o CVA, tobacco use disorder  OBJECTIVE:   BP (!) 150/104   Pulse (!) 105   Wt 208 lb 6.4 oz (94.5 kg)   SpO2 98%   BMI 32.64 kg/m   General: NAD, able to participate in exam CV: mildly tachycardic, normal S1/S2 without m/r/g Respiratory: No respiratory distress Skin: warm and dry, no rashes noted Psych: Normal affect and mood Neuro: grossly intact Ext: no peripheral edema   ASSESSMENT/PLAN:   Essential hypertension Poorly controlled. BP significantly elevated to 160/111 today with repeat measurement 150/104. Medication adherence remains a challenge for him.  -Change from Losartan '25mg'$  daily to Losartan-HCTZ 50-12.'5mg'$  daily -Check BMP today -His alcohol use is almost certainly contributing as well, but patient not ready to cut back. Continue to address at future visits -Follow up in 2-3 weeks  Alcohol intake above recommended sensible limits Drinks "1/2 a gallon" of vodka every weekend. Brief counseling provided. Patient not ready to cut back.  Recommend AAA screening and low-dose lung CT at next appt due to tobacco use.  Alcus Dad, MD [place]

## 2021-11-04 ENCOUNTER — Encounter: Payer: Self-pay | Admitting: Family Medicine

## 2021-11-04 ENCOUNTER — Ambulatory Visit (INDEPENDENT_AMBULATORY_CARE_PROVIDER_SITE_OTHER): Payer: Medicare Other | Admitting: Family Medicine

## 2021-11-04 ENCOUNTER — Other Ambulatory Visit: Payer: Self-pay

## 2021-11-04 VITALS — BP 150/104 | HR 105 | Wt 208.4 lb

## 2021-11-04 DIAGNOSIS — R7303 Prediabetes: Secondary | ICD-10-CM

## 2021-11-04 DIAGNOSIS — F1090 Alcohol use, unspecified, uncomplicated: Secondary | ICD-10-CM | POA: Diagnosis not present

## 2021-11-04 DIAGNOSIS — I10 Essential (primary) hypertension: Secondary | ICD-10-CM | POA: Diagnosis not present

## 2021-11-04 DIAGNOSIS — Z720 Tobacco use: Secondary | ICD-10-CM

## 2021-11-04 MED ORDER — LOSARTAN POTASSIUM-HCTZ 50-12.5 MG PO TABS: 1.0000 | ORAL_TABLET | Freq: Every day | ORAL | 0 refills | Status: DC

## 2021-11-04 NOTE — Patient Instructions (Addendum)
It was good to see you!  Your blood pressure was VERY high today.  High blood pressure puts you at increased risk of stroke, heart disease, and kidney problems.  I sent a new blood pressure medication to your pharmacy. Take it once daily. STOP your old blood pressure medication (Losartan).  Come back in 2-3 weeks for follow up.  We are going to check some blood work. We will discuss the results at your follow up appointment.  Take care, Dr Rock Nephew

## 2021-11-04 NOTE — Assessment & Plan Note (Signed)
Poorly controlled. BP significantly elevated to 160/111 today with repeat measurement 150/104. Medication adherence remains a challenge for him.  -Change from Losartan '25mg'$  daily to Losartan-HCTZ 50-12.'5mg'$  daily -Check BMP today -His alcohol use is almost certainly contributing as well, but patient not ready to cut back. Continue to address at future visits -Follow up in 2-3 weeks

## 2021-11-04 NOTE — Assessment & Plan Note (Signed)
Drinks "1/2 a gallon" of vodka every weekend. Brief counseling provided. Patient not ready to cut back.

## 2021-11-05 ENCOUNTER — Encounter: Payer: Self-pay | Admitting: Family Medicine

## 2021-11-05 LAB — BASIC METABOLIC PANEL
BUN/Creatinine Ratio: 14 (ref 10–24)
BUN: 14 mg/dL (ref 8–27)
CO2: 17 mmol/L — ABNORMAL LOW (ref 20–29)
Calcium: 9.8 mg/dL (ref 8.6–10.2)
Chloride: 103 mmol/L (ref 96–106)
Creatinine, Ser: 0.99 mg/dL (ref 0.76–1.27)
Glucose: 116 mg/dL — ABNORMAL HIGH (ref 70–99)
Potassium: 3.5 mmol/L (ref 3.5–5.2)
Sodium: 142 mmol/L (ref 134–144)
eGFR: 84 mL/min/{1.73_m2} (ref >59)

## 2021-11-27 ENCOUNTER — Ambulatory Visit: Payer: Medicare Other | Admitting: Family Medicine

## 2021-12-01 ENCOUNTER — Other Ambulatory Visit: Payer: Self-pay | Admitting: Family Medicine

## 2021-12-01 DIAGNOSIS — I10 Essential (primary) hypertension: Secondary | ICD-10-CM

## 2021-12-04 ENCOUNTER — Encounter: Payer: Self-pay | Admitting: Family Medicine

## 2021-12-04 ENCOUNTER — Ambulatory Visit (INDEPENDENT_AMBULATORY_CARE_PROVIDER_SITE_OTHER): Payer: Medicare Other | Admitting: Family Medicine

## 2021-12-04 VITALS — BP 138/84 | HR 110 | Ht 67.0 in | Wt 207.0 lb

## 2021-12-04 DIAGNOSIS — Z0000 Encounter for general adult medical examination without abnormal findings: Secondary | ICD-10-CM

## 2021-12-04 DIAGNOSIS — Z720 Tobacco use: Secondary | ICD-10-CM | POA: Diagnosis not present

## 2021-12-04 DIAGNOSIS — Z23 Encounter for immunization: Secondary | ICD-10-CM

## 2021-12-04 DIAGNOSIS — F1090 Alcohol use, unspecified, uncomplicated: Secondary | ICD-10-CM | POA: Diagnosis not present

## 2021-12-04 DIAGNOSIS — J302 Other seasonal allergic rhinitis: Secondary | ICD-10-CM

## 2021-12-04 DIAGNOSIS — Z1211 Encounter for screening for malignant neoplasm of colon: Secondary | ICD-10-CM | POA: Diagnosis not present

## 2021-12-04 DIAGNOSIS — I10 Essential (primary) hypertension: Secondary | ICD-10-CM | POA: Diagnosis not present

## 2021-12-04 DIAGNOSIS — R000 Tachycardia, unspecified: Secondary | ICD-10-CM | POA: Diagnosis not present

## 2021-12-04 MED ORDER — ZOSTER VAC RECOMB ADJUVANTED 50 MCG/0.5ML IM SUSR: 0.5000 mL | Freq: Once | INTRAMUSCULAR | 0 refills | Status: AC

## 2021-12-04 MED ORDER — CETIRIZINE HCL 10 MG PO TABS: 10.0000 mg | ORAL_TABLET | Freq: Every day | ORAL | 3 refills | Status: AC

## 2021-12-04 MED ORDER — TETANUS-DIPHTH-ACELL PERTUSSIS 5-2.5-18.5 LF-MCG/0.5 IM SUSP: 0.5000 mL | Freq: Once | INTRAMUSCULAR | 0 refills | Status: AC

## 2021-12-04 NOTE — Assessment & Plan Note (Addendum)
BP significantly improved after increase in Losartan and addition of HCTZ. Adequately controlled today. BMP wnl last month. Continue Losartan-HCTZ 50-'25mg'$  daily. Follow up in 2 months

## 2021-12-04 NOTE — Patient Instructions (Addendum)
It was great to see you!  Things we discussed: Your blood pressure is much improved. Continue taking the Losartan-HCTZ daily I have ordered 2 imaging studies- one to screen for lung cancer and one to screen for an aortic aneurysm Get your shingles vaccine and tetanus shot at your pharmacy I highly recommend you cut back on your alcohol use and quit smoking. These are the most important things you can do for your health. Please let me know if and when you're ready. We did your pneumonia vaccine today. You may have a sore arm and experience low grade fever or fatigue but these should resolve in 24-48 hours. I have placed a referral to GI for your colonoscopy. Someone will call you in a few weeks to schedule an appointment. I sent allergy medication (cetirizine) to your pharmacy to take once daily. [facility] will call you regarding the surgery to remove your lipoma   Take care, Dr Rock Nephew

## 2021-12-04 NOTE — Progress Notes (Signed)
    SUBJECTIVE:   CHIEF COMPLAINT / HPI:   Hypertension: Patient is a 66 y.o. male who presents today for HTN follow-up. Home medications include: Losartan-HCTZ 50-12.'5mg'$  daily. Patient reports good compliance- at the very most forgets his dose once per week.  Patient does not check blood pressure at home. Patient has had a BMP in the past 1 year.  Health Maintenance Due for the following items: -AAA screening -low dose lung CT -PCV 20 -Shingrix -TDaP -colon cancer screening (reports he sent a stool sample and it was "positive")  Smokes 1PPD Drinks "1/2 gallon of vodka" every weekend- not interested in cutting back  Neck Lipoma -Had to cancel his surgery earlier this year due to scheduling conflict -Wants to reschedule but hasn't heard back from the surgery office -Requests assistance in scheduling  Seasonal Allergies Endorses runny nose, itchy watery eyes for few months duration.   PERTINENT  PMH / PSH: binge drinking, tobacco use disorder, h/o CVA  OBJECTIVE:   BP 138/84   Pulse (!) 110   Ht '5\' 7"'$  (1.702 m)   Wt 207 lb (93.9 kg)   SpO2 98%   BMI 32.42 kg/m   Gen: NAD, pleasant, able to participate in exam CV: RRR, normal S1/S2, no murmur Resp: Normal effort, lungs CTAB GI: abdomen soft, non-tender, non-distended Extremities: no edema or cyanosis Skin: warm and dry, no rashes noted Neuro: alert, no obvious focal deficits, hands minimally tremulous Psych: Normal affect and mood  ASSESSMENT/PLAN:   Essential hypertension BP significantly improved after increase in Losartan and addition of HCTZ. Adequately controlled today. BMP wnl last month. Continue Losartan-HCTZ 50-'25mg'$  daily. Follow up in 2 months  Tobacco use Continues to smoke 1PPD. Not interested in quitting. Advised to let me know when he's ready to cut back. AAA and lung cancer screening as below.  Alcohol intake above recommended sensible limits Continues to drink "1/2 gallon of vodka" every  weekend. States he doesn't drink Sun-Thurs. No history of withdrawal. I suspect this is contributing to his tachycardia. Encouraged him to cut back but patient not interested at this time.  Tachycardia HR 110 today. Unchanged on repeat check. Per chart review was 105 at last visit. Possibly related to alcohol use/slight withdrawal. Denies pain, no fever, no suspicion for PE. If persistent at next visit, check TSH and CBC to r/o anemia.   Seasonal Allergies -Rx sent for Cetirizine '10mg'$  daily  Health Maintenance -PCV20 given today -AAA screening ordered (current 1PPD smoker) -low dose lung CT ordered (>20 pack year smoking history) -referral to GI for colonoscopy -patient given printed Rx to receive TDaP and Shingrix at his pharmacy  Alcus Dad, [place]

## 2021-12-04 NOTE — Assessment & Plan Note (Addendum)
Continues to smoke 1PPD. Not interested in quitting. Advised to let me know when he's ready to cut back. AAA and lung cancer screening as below.

## 2021-12-04 NOTE — Assessment & Plan Note (Signed)
Continues to drink "1/2 gallon of vodka" every weekend. States he doesn't drink Sun-Thurs. No history of withdrawal. I suspect this is contributing to his tachycardia. Encouraged him to cut back but patient not interested at this time.

## 2021-12-05 DIAGNOSIS — R000 Tachycardia, unspecified: Secondary | ICD-10-CM | POA: Insufficient documentation

## 2021-12-05 NOTE — Assessment & Plan Note (Addendum)
HR 110 today. Unchanged on repeat check. Per chart review was 105 at last visit. Possibly related to alcohol use/slight withdrawal. Denies pain, no fever, no suspicion for PE. If persistent at next visit, check TSH and CBC to r/o anemia.

## 2021-12-15 ENCOUNTER — Encounter: Payer: Self-pay | Admitting: Family Medicine

## 2021-12-15 ENCOUNTER — Ambulatory Visit (HOSPITAL_COMMUNITY)
Admission: RE | Admit: 2021-12-15 | Discharge: 2021-12-15 | Disposition: A | Discharge status: Home / Self Care | Payer: Medicare Other | Source: Ambulatory Visit | Attending: Cardiology | Admitting: Cardiology

## 2021-12-15 DIAGNOSIS — Z720 Tobacco use: Secondary | ICD-10-CM | POA: Insufficient documentation

## 2021-12-15 DIAGNOSIS — Z136 Encounter for screening for cardiovascular disorders: Secondary | ICD-10-CM | POA: Insufficient documentation

## 2021-12-15 DIAGNOSIS — F17210 Nicotine dependence, cigarettes, uncomplicated: Secondary | ICD-10-CM | POA: Insufficient documentation

## 2021-12-15 DIAGNOSIS — Z8673 Personal history of transient ischemic attack (TIA), and cerebral infarction without residual deficits: Secondary | ICD-10-CM | POA: Insufficient documentation

## 2021-12-15 DIAGNOSIS — Z0000 Encounter for general adult medical examination without abnormal findings: Secondary | ICD-10-CM | POA: Insufficient documentation

## 2021-12-15 DIAGNOSIS — I10 Essential (primary) hypertension: Secondary | ICD-10-CM | POA: Diagnosis not present

## 2021-12-19 ENCOUNTER — Ambulatory Visit (HOSPITAL_COMMUNITY): Payer: Medicare Other

## 2022-02-26 ENCOUNTER — Emergency Department (HOSPITAL_COMMUNITY): Payer: Medicare Other

## 2022-02-26 ENCOUNTER — Inpatient Hospital Stay (HOSPITAL_COMMUNITY)
Admission: EM | Admit: 2022-02-26 | Discharge: 2022-03-25 | DRG: 023 | Disposition: E | Payer: Medicare Other | Attending: Neurology | Admitting: Neurology

## 2022-02-26 ENCOUNTER — Encounter (HOSPITAL_COMMUNITY): Payer: Self-pay | Admitting: Emergency Medicine

## 2022-02-26 ENCOUNTER — Other Ambulatory Visit: Payer: Self-pay

## 2022-02-26 DIAGNOSIS — G8194 Hemiplegia, unspecified affecting left nondominant side: Secondary | ICD-10-CM | POA: Diagnosis present

## 2022-02-26 DIAGNOSIS — F1010 Alcohol abuse, uncomplicated: Secondary | ICD-10-CM | POA: Diagnosis present

## 2022-02-26 DIAGNOSIS — E876 Hypokalemia: Secondary | ICD-10-CM | POA: Diagnosis present

## 2022-02-26 DIAGNOSIS — K047 Periapical abscess without sinus: Secondary | ICD-10-CM | POA: Diagnosis present

## 2022-02-26 DIAGNOSIS — R7303 Prediabetes: Secondary | ICD-10-CM | POA: Diagnosis present

## 2022-02-26 DIAGNOSIS — I10 Essential (primary) hypertension: Secondary | ICD-10-CM | POA: Diagnosis present

## 2022-02-26 DIAGNOSIS — R29810 Facial weakness: Secondary | ICD-10-CM | POA: Diagnosis present

## 2022-02-26 DIAGNOSIS — Z515 Encounter for palliative care: Secondary | ICD-10-CM | POA: Diagnosis not present

## 2022-02-26 DIAGNOSIS — J9600 Acute respiratory failure, unspecified whether with hypoxia or hypercapnia: Secondary | ICD-10-CM

## 2022-02-26 DIAGNOSIS — I63531 Cerebral infarction due to unspecified occlusion or stenosis of right posterior cerebral artery: Secondary | ICD-10-CM | POA: Diagnosis not present

## 2022-02-26 DIAGNOSIS — G936 Cerebral edema: Secondary | ICD-10-CM | POA: Diagnosis present

## 2022-02-26 DIAGNOSIS — I63533 Cerebral infarction due to unspecified occlusion or stenosis of bilateral posterior cerebral arteries: Secondary | ICD-10-CM | POA: Diagnosis not present

## 2022-02-26 DIAGNOSIS — I6389 Other cerebral infarction: Secondary | ICD-10-CM | POA: Diagnosis not present

## 2022-02-26 DIAGNOSIS — I679 Cerebrovascular disease, unspecified: Secondary | ICD-10-CM | POA: Diagnosis not present

## 2022-02-26 DIAGNOSIS — R29703 NIHSS score 3: Secondary | ICD-10-CM | POA: Diagnosis present

## 2022-02-26 DIAGNOSIS — I651 Occlusion and stenosis of basilar artery: Secondary | ICD-10-CM | POA: Diagnosis not present

## 2022-02-26 DIAGNOSIS — R1310 Dysphagia, unspecified: Secondary | ICD-10-CM | POA: Diagnosis present

## 2022-02-26 DIAGNOSIS — J9601 Acute respiratory failure with hypoxia: Secondary | ICD-10-CM | POA: Diagnosis present

## 2022-02-26 DIAGNOSIS — I63541 Cerebral infarction due to unspecified occlusion or stenosis of right cerebellar artery: Principal | ICD-10-CM | POA: Diagnosis present

## 2022-02-26 DIAGNOSIS — I63213 Cerebral infarction due to unspecified occlusion or stenosis of bilateral vertebral arteries: Secondary | ICD-10-CM | POA: Diagnosis not present

## 2022-02-26 DIAGNOSIS — Z6832 Body mass index (BMI) 32.0-32.9, adult: Secondary | ICD-10-CM

## 2022-02-26 DIAGNOSIS — Z79899 Other long term (current) drug therapy: Secondary | ICD-10-CM

## 2022-02-26 DIAGNOSIS — E785 Hyperlipidemia, unspecified: Secondary | ICD-10-CM | POA: Diagnosis present

## 2022-02-26 DIAGNOSIS — I6601 Occlusion and stenosis of right middle cerebral artery: Secondary | ICD-10-CM | POA: Diagnosis present

## 2022-02-26 DIAGNOSIS — G935 Compression of brain: Secondary | ICD-10-CM | POA: Diagnosis present

## 2022-02-26 DIAGNOSIS — F17210 Nicotine dependence, cigarettes, uncomplicated: Secondary | ICD-10-CM | POA: Diagnosis present

## 2022-02-26 DIAGNOSIS — D638 Anemia in other chronic diseases classified elsewhere: Secondary | ICD-10-CM | POA: Diagnosis present

## 2022-02-26 DIAGNOSIS — Z7982 Long term (current) use of aspirin: Secondary | ICD-10-CM

## 2022-02-26 DIAGNOSIS — K029 Dental caries, unspecified: Secondary | ICD-10-CM | POA: Diagnosis present

## 2022-02-26 DIAGNOSIS — I639 Cerebral infarction, unspecified: Secondary | ICD-10-CM | POA: Diagnosis present

## 2022-02-26 DIAGNOSIS — I6503 Occlusion and stenosis of bilateral vertebral arteries: Secondary | ICD-10-CM | POA: Diagnosis present

## 2022-02-26 DIAGNOSIS — I63539 Cerebral infarction due to unspecified occlusion or stenosis of unspecified posterior cerebral artery: Secondary | ICD-10-CM | POA: Diagnosis present

## 2022-02-26 DIAGNOSIS — Z720 Tobacco use: Secondary | ICD-10-CM | POA: Diagnosis present

## 2022-02-26 DIAGNOSIS — E669 Obesity, unspecified: Secondary | ICD-10-CM | POA: Diagnosis present

## 2022-02-26 DIAGNOSIS — Z66 Do not resuscitate: Secondary | ICD-10-CM | POA: Diagnosis present

## 2022-02-26 DIAGNOSIS — R1312 Dysphagia, oropharyngeal phase: Secondary | ICD-10-CM | POA: Diagnosis not present

## 2022-02-26 DIAGNOSIS — Z8673 Personal history of transient ischemic attack (TIA), and cerebral infarction without residual deficits: Secondary | ICD-10-CM | POA: Diagnosis not present

## 2022-02-26 DIAGNOSIS — I6302 Cerebral infarction due to thrombosis of basilar artery: Secondary | ICD-10-CM | POA: Diagnosis not present

## 2022-02-26 LAB — BASIC METABOLIC PANEL
Anion gap: 9 (ref 5–15)
BUN: 28 mg/dL — ABNORMAL HIGH (ref 8–23)
CO2: 22 mmol/L (ref 22–32)
Calcium: 9.6 mg/dL (ref 8.9–10.3)
Chloride: 108 mmol/L (ref 98–111)
Creatinine, Ser: 1.06 mg/dL (ref 0.61–1.24)
GFR, Estimated: >60 mL/min (ref >60)
Glucose, Bld: 142 mg/dL — ABNORMAL HIGH (ref 70–99)
Potassium: 3.2 mmol/L — ABNORMAL LOW (ref 3.5–5.1)
Sodium: 139 mmol/L (ref 135–145)

## 2022-02-26 LAB — CBC WITH DIFFERENTIAL/PLATELET
Abs Immature Granulocytes: 0.03 10*3/uL (ref 0.00–0.07)
Basophils Absolute: 0 10*3/uL (ref 0.0–0.1)
Basophils Relative: 1 %
Eosinophils Absolute: 0.1 10*3/uL (ref 0.0–0.5)
Eosinophils Relative: 1 %
HCT: 40.2 % (ref 39.0–52.0)
Hemoglobin: 13.3 g/dL (ref 13.0–17.0)
Immature Granulocytes: 1 %
Lymphocytes Relative: 44 %
Lymphs Abs: 2.2 10*3/uL (ref 0.7–4.0)
MCH: 31.5 pg (ref 26.0–34.0)
MCHC: 33.1 g/dL (ref 30.0–36.0)
MCV: 95.3 fL (ref 80.0–100.0)
Monocytes Absolute: 0.8 10*3/uL (ref 0.1–1.0)
Monocytes Relative: 16 %
Neutro Abs: 1.9 10*3/uL (ref 1.7–7.7)
Neutrophils Relative %: 37 %
Platelets: 193 10*3/uL (ref 150–400)
RBC: 4.22 MIL/uL (ref 4.22–5.81)
RDW: 16.1 % — ABNORMAL HIGH (ref 11.5–15.5)
WBC: 5 10*3/uL (ref 4.0–10.5)
nRBC: 0 % (ref 0.0–0.2)

## 2022-02-26 LAB — RAPID URINE DRUG SCREEN, HOSP PERFORMED
Amphetamines: NOT DETECTED
Barbiturates: NOT DETECTED
Benzodiazepines: NOT DETECTED
Cocaine: NOT DETECTED
Opiates: NOT DETECTED
Tetrahydrocannabinol: NOT DETECTED

## 2022-02-26 LAB — I-STAT CHEM 8, ED
BUN: 32 mg/dL — ABNORMAL HIGH (ref 8–23)
Calcium, Ion: 1.22 mmol/L (ref 1.15–1.40)
Chloride: 106 mmol/L (ref 98–111)
Creatinine, Ser: 1.1 mg/dL (ref 0.61–1.24)
Glucose, Bld: 140 mg/dL — ABNORMAL HIGH (ref 70–99)
HCT: 41 % (ref 39.0–52.0)
Hemoglobin: 13.9 g/dL (ref 13.0–17.0)
Potassium: 3.5 mmol/L (ref 3.5–5.1)
Sodium: 140 mmol/L (ref 135–145)
TCO2: 25 mmol/L (ref 22–32)

## 2022-02-26 LAB — HIV ANTIBODY (ROUTINE TESTING W REFLEX): HIV Screen 4th Generation wRfx: NONREACTIVE

## 2022-02-26 MED ORDER — SODIUM CHLORIDE 0.9 % IV BOLUS
1000.0000 mL | Freq: Once | INTRAVENOUS | Status: AC
  Administered 2022-02-26: 1000 mL via INTRAVENOUS

## 2022-02-26 MED ORDER — CLOPIDOGREL BISULFATE 75 MG PO TABS
75.0000 mg | ORAL_TABLET | Freq: Every day | ORAL | Status: DC
  Administered 2022-02-26 – 2022-02-28 (×3): 75 mg via ORAL
  Filled 2022-02-26 (×3): qty 1

## 2022-02-26 MED ORDER — POTASSIUM CHLORIDE CRYS ER 20 MEQ PO TBCR
40.0000 meq | EXTENDED_RELEASE_TABLET | Freq: Once | ORAL | Status: AC
  Administered 2022-02-26: 40 meq via ORAL
  Filled 2022-02-26: qty 2

## 2022-02-26 MED ORDER — NICOTINE 14 MG/24HR TD PT24
14.0000 mg | MEDICATED_PATCH | Freq: Every day | TRANSDERMAL | Status: DC
  Administered 2022-02-26 – 2022-03-01 (×4): 14 mg via TRANSDERMAL
  Filled 2022-02-26 (×5): qty 1

## 2022-02-26 MED ORDER — ATORVASTATIN CALCIUM 10 MG PO TABS
20.0000 mg | ORAL_TABLET | Freq: Every day | ORAL | Status: DC
  Administered 2022-02-26 – 2022-02-27 (×2): 20 mg via ORAL
  Filled 2022-02-26 (×2): qty 2

## 2022-02-26 MED ORDER — ORAL CARE MOUTH RINSE: 15.0000 mL | OROMUCOSAL | Status: DC | PRN

## 2022-02-26 MED ORDER — MECLIZINE HCL 25 MG PO TABS
25.0000 mg | ORAL_TABLET | Freq: Once | ORAL | Status: AC
  Administered 2022-02-26: 25 mg via ORAL
  Filled 2022-02-26: qty 1

## 2022-02-26 MED ORDER — PENICILLIN V POTASSIUM 250 MG PO TABS
500.0000 mg | ORAL_TABLET | Freq: Four times a day (QID) | ORAL | Status: DC
  Administered 2022-02-26 – 2022-02-27 (×6): 500 mg via ORAL
  Filled 2022-02-26 (×9): qty 2

## 2022-02-26 MED ORDER — ASPIRIN 325 MG PO TABS
325.0000 mg | ORAL_TABLET | Freq: Every day | ORAL | Status: DC
  Administered 2022-02-26 – 2022-02-28 (×3): 325 mg via ORAL
  Filled 2022-02-26 (×3): qty 1

## 2022-02-26 MED ORDER — CARBOXYMETHYLCELLULOSE SODIUM 0.5 % OP SOLN: 1.0000 [drp] | Freq: Every day | OPHTHALMIC | Status: DC | PRN

## 2022-02-26 MED ORDER — IOHEXOL 350 MG/ML SOLN
100.0000 mL | Freq: Once | INTRAVENOUS | Status: AC | PRN
  Administered 2022-02-26: 75 mL via INTRAVENOUS

## 2022-02-26 MED ORDER — POLYVINYL ALCOHOL 1.4 % OP SOLN
1.0000 [drp] | OPHTHALMIC | Status: DC | PRN
  Filled 2022-02-26: qty 15

## 2022-02-26 MED ORDER — ENOXAPARIN SODIUM 40 MG/0.4ML IJ SOSY
40.0000 mg | PREFILLED_SYRINGE | INTRAMUSCULAR | Status: DC
  Administered 2022-02-26 – 2022-03-01 (×3): 40 mg via SUBCUTANEOUS
  Filled 2022-02-26 (×3): qty 0.4

## 2022-02-26 NOTE — ED Notes (Signed)
ED TO INPATIENT HANDOFF REPORT  ED Nurse Name and Phone #: Jeannie Done Sawyer Name/Age/Gender [name] 66 y.o. male Room/Bed: 032C/032C  Code Status   Code Status: Full Code  Home/SNF/Other Home Patient oriented to: self, place, time, and situation Is this baseline? Yes   Triage Complete: Triage complete  Chief Complaint Cerebral vascular accident [facility]) [I63.9]  Triage Note Pt BIB [facility] c/o dizziness. Fall yesterday AM getting out of bed (pt denies taking thinners), pt felt as if his left leg was not working right, and had blurred vision at 1000. Sx have since subsided but continues to have dizziness. Per EMS stroke screen was negative. Ambulatory on scene but c/o dizziness. EKG showed ST with PVCs. CBG 145. BP 160/100.    Allergies No Known Allergies  Level of Care/Admitting Diagnosis ED Disposition     ED Disposition  Admit   Condition  --   Comment  Hospital Area: [place] [[id number]]  Level of Care: Progressive [102]  Admit to Progressive based on following criteria: NEUROLOGICAL AND NEUROSURGICAL complex patients with significant risk of instability, who do not meet ICU criteria, yet require close observation or frequent assessment (< / = every 2 - 4 hours) with medical / nursing intervention.  May admit patient to [facility] or [facility] if equivalent level of care is available:: No  Covid Evaluation: Asymptomatic - no recent exposure (last 10 days) testing not required  Diagnosis: Cerebral vascular accident [facility]) [[id number]]  Admitting Physician: Gerrit Heck [[id number]]  Attending Physician: Zenia Resides [[id number]]  Certification:: I certify this patient will need inpatient services for at least 2 midnights  Estimated Length of Stay: 2          B Medical/Surgery History Past Medical History:  Diagnosis Date   Stroke [facility])    History reviewed. No pertinent surgical history.   A IV Location/Drains/Wounds Patient  Lines/Drains/Airways Status     Active Line/Drains/Airways     Name Placement date Placement time Site Days   Peripheral IV 03/04/2022 20 G Left Antecubital 03/09/2022  0000  Antecubital  less than 1            Intake/Output Last 24 hours No intake or output data in the 24 hours ending 03/18/2022 1819  Labs/Imaging Results for orders placed or performed during the hospital encounter of 03/24/2022 (from the past 48 hour(s))  CBC with Differential     Status: Abnormal   Collection Time: 03/17/2022 11:45 AM  Result Value Ref Range   WBC 5.0 4.0 - 10.5 K/uL   RBC 4.22 4.22 - 5.81 MIL/uL   Hemoglobin 13.3 13.0 - 17.0 g/dL   HCT 40.2 39.0 - 52.0 %   MCV 95.3 80.0 - 100.0 fL   MCH 31.5 26.0 - 34.0 pg   MCHC 33.1 30.0 - 36.0 g/dL   RDW 16.1 (H) 11.5 - 15.5 %   Platelets 193 150 - 400 K/uL   nRBC 0.0 0.0 - 0.2 %   Neutrophils Relative % 37 %   Neutro Abs 1.9 1.7 - 7.7 K/uL   Lymphocytes Relative 44 %   Lymphs Abs 2.2 0.7 - 4.0 K/uL   Monocytes Relative 16 %   Monocytes Absolute 0.8 0.1 - 1.0 K/uL   Eosinophils Relative 1 %   Eosinophils Absolute 0.1 0.0 - 0.5 K/uL   Basophils Relative 1 %   Basophils Absolute 0.0 0.0 - 0.1 K/uL   Immature Granulocytes 1 %   Abs Immature  Granulocytes 0.03 0.00 - 0.07 K/uL    Comment: Performed at [facility], [place] [address], [place], [place] [address]  Basic metabolic panel     Status: Abnormal   Collection Time: 03/07/2022 11:45 AM  Result Value Ref Range   Sodium 139 135 - 145 mmol/L   Potassium 3.2 (L) 3.5 - 5.1 mmol/L   Chloride 108 98 - 111 mmol/L   CO2 22 22 - 32 mmol/L   Glucose, Bld 142 (H) 70 - 99 mg/dL    Comment: Glucose reference range applies only to samples taken after fasting for at least 8 hours.   BUN 28 (H) 8 - 23 mg/dL   Creatinine, Ser 1.06 0.61 - 1.24 mg/dL   Calcium 9.6 8.9 - 10.3 mg/dL   GFR, Estimated >60 >60 mL/min    Comment: (NOTE) Calculated using the CKD-EPI Creatinine Equation (2021)    Anion gap 9 5 - 15     Comment: Performed at [place] [facility] [address] [place], [place] [address]  I-stat chem 8, ED (not at [facility] or [facility])     Status: Abnormal   Collection Time: 03/08/2022 12:54 PM  Result Value Ref Range   Sodium 140 135 - 145 mmol/L   Potassium 3.5 3.5 - 5.1 mmol/L   Chloride 106 98 - 111 mmol/L   BUN 32 (H) 8 - 23 mg/dL   Creatinine, Ser 1.10 0.61 - 1.24 mg/dL   Glucose, Bld 140 (H) 70 - 99 mg/dL    Comment: Glucose reference range applies only to samples taken after fasting for at least 8 hours.   Calcium, Ion 1.22 1.15 - 1.40 mmol/L   TCO2 25 22 - 32 mmol/L   Hemoglobin 13.9 13.0 - 17.0 g/dL   HCT 41.0 39.0 - 52.0 %   CT ANGIO HEAD NECK W WO CM  Result Date: 03/23/2022 CLINICAL DATA:  Stroke suspected. EXAM: CT ANGIOGRAPHY HEAD AND NECK TECHNIQUE: Multidetector CT imaging of the head and neck was performed using the standard protocol during bolus administration of intravenous contrast. Multiplanar CT image reconstructions and MIPs were obtained to evaluate the vascular anatomy. Carotid stenosis measurements (when applicable) are obtained utilizing NASCET criteria, using the distal internal carotid diameter as the denominator. RADIATION DOSE REDUCTION: This exam was performed according to the departmental dose-optimization program which includes automated exposure control, adjustment of the mA and/or kV according to patient size and/or use of iterative reconstruction technique. CONTRAST:  13m OMNIPAQUE IOHEXOL 350 MG/ML SOLN COMPARISON:  MRA 11/11/10 FINDINGS: CT HEAD FINDINGS Brain: Infarcts seen on same day MRI are not visualized on this exam. There is no evidence of hemorrhage. No extra-axial fluid collection. No hydrocephalus. Vascular: See below for vascular findings Skull: Normal. Negative for fracture or focal lesion. Sinuses/Orbits: There is polypoid mucosal thickening in the right maxillary sinus and an air-fluid level in the left maxillary sinus. There is a chronic fracture of the  left lamina papyracea. There is also mucosal thickening involving the bilateral frontal sinuses. Other: There is extensive odontogenic disease with a soft tissue abscess along the left mandible measuring 1.5 x 0.3 cm (series 100, image 208). Review of the MIP images confirms the above findings CTA NECK FINDINGS Aortic arch: 2 vessel aortic arch. The proximal aspect of the branch vessels are poorly assessed due to streak artifact from contrast bolus in the subclavian vein imaged portion shows no evidence of aneurysm or dissection. No significant stenosis of the major arch vessel origins. Right  carotid system: No evidence of dissection, stenosis (50% or greater), or occlusion. Left carotid system: No evidence of dissection, stenosis (50% or greater), or occlusion. Vertebral arteries: The right vertebral artery is occluded at the origin. There is intermittent opacification in the V2 segment. There is opacification at the V4 and V3 segments. The non dominant left vertebral artery is contrast opacified to the V 3 segment. The V4 segment is small in caliber and may be intermittently occluded (series 11, image 148). 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 Other neck: Negative. Upper chest: Negative. Review of the MIP images confirms the above findings CTA HEAD FINDINGS Anterior circulation: No significant stenosis, proximal occlusion, aneurysm, or vascular malformation. Posterior circulation: P1 segments of bilateral PCAs are contrast opacified. There is non opacification of the P2 and distal PCA branches bilaterally. Venous sinuses: As permitted by contrast timing, patent. Anatomic variants: None Review of the MIP images confirms the above findings IMPRESSION: 1. 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 The V4 segment of the non dominant left vertebral artery is also  likely intermittently occluded. Basilar artery and bilateral P1 segments are contrast opacified. 2. Non opacification of the P2 and distal PCA branches bilaterally. 3. 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 4. 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 Findings were discussed with Dr. Ronnald Nian on 03/22/2022 at 2:49 PM. Electronically Signed   By: Marin Roberts M.D.   On: 02/24/2022 14:55   MR BRAIN WO CONTRAST  Result Date: 02/25/2022 CLINICAL DATA:  Stroke suspected.  Dizziness.  Fall yesterday. EXAM: MRI HEAD WITHOUT CONTRAST TECHNIQUE: Multiplanar, multiecho pulse sequences of the brain and surrounding structures were obtained without intravenous contrast. COMPARISON:  CT Neck 05/02/21 FINDINGS: Brain: There are multiple supra and infratentorial acute infarcts including in the right cerebellar hemisphere and right occipital lobe. This is likely an additional recent infarct in the left thalamus with mild hypointense signal on the ADC map (series 5, image 76). There are additional chronic appearing infarcts in the left occipital lobe (series 11, image 12), left frontal lobe, left parietal lobe, and the right cerebellum (series 11, image 6). No evidence of acute hemorrhage. No hydrocephalus. No extra-axial fluid collection., Vascular: Absent flow void in the V3 and V4 segments of the right vertebral artery, new from prior CT neck. Skull and upper cervical spine: Normal marrow signal. Sinuses/Orbits: Mucosal thickening bilateral maxillary, ethmoid, frontal sinuses. There is an air-fluid level in the left maxillary sinus, which can be seen in the setting of acute sinusitis; correlate with symptomatology. Other: None. IMPRESSION: 1. Posterior circulation acute infarcts in the right cerebellar hemisphere and right occipital lobe, as well as the left thalamus. Asymmetricall absent flow void in the V3 and V4 segments of the  right vertebral artery is worrisome for occlusion and/or dissection, new from CT neck 05/02/21. Recommend further evaluation with CTA. No hemorrhage. 2. Additional chronic infarcts in the left occipital lobe, left frontal lobe, left parietal lobe, and the right cerebellum. 3. Paranasal sinus disease with an air-fluid level in the left maxillary sinus, which can be seen in the setting of acute sinusitis. These results will be called to the ordering clinician or representative by the Radiologist Assistant, and communication documented in the PACS or [employer]. Electronically Signed  By: Marin Roberts M.D.   On: 02/22/2022 14:00    Pending Labs Unresulted Labs (From admission, onward)     Start     Ordered   02/27/22 0500  Hemoglobin A1c  Tomorrow morning,   R        03/06/2022 1644   02/27/22 0500  TSH  Tomorrow morning,   R        03/22/2022 1644   02/27/22 4235  Basic metabolic panel  Tomorrow morning,   R        02/27/2022 1644   02/27/22 0500  CBC  Tomorrow morning,   R        03/14/2022 1644   02/27/22 0500  Lipid panel  Tomorrow morning,   R        03/17/2022 1651   03/01/2022 1644  Rapid urine drug screen (hospital performed)  ONCE - STAT,   STAT        03/15/2022 1644   02/27/2022 1627  HIV Antibody (routine testing w rflx)  (HIV Antibody (Routine testing w reflex) panel)  Once,   R        03/10/2022 1644            Vitals/Pain Today's Vitals   03/18/2022 1715 03/21/2022 1730 02/27/2022 1745 03/09/2022 1800  BP: (!) 145/101 (!) 142/96 (!) 138/95 (!) 142/89  Pulse: 87 84 88 79  Resp: 15 18 (!) 24 17  Temp:      TempSrc:      SpO2: 93% 92% 98% 99%  PainSc:        Isolation Precautions No active isolations  Medications Medications  enoxaparin (LOVENOX) injection 40 mg (40 mg Subcutaneous Given 02/24/2022 1816)  nicotine (NICODERM CQ - dosed in mg/24 hours) patch 14 mg (14 mg Transdermal Patch Applied 02/23/2022 1815)  penicillin v potassium (VEETID) tablet 500 mg (500 mg Oral Given 03/13/2022  1814)  aspirin tablet 325 mg (325 mg Oral Given 03/17/2022 1814)  atorvastatin (LIPITOR) tablet 20 mg (20 mg Oral Given 03/11/2022 1814)  polyvinyl alcohol (LIQUIFILM TEARS) 1.4 % ophthalmic solution 1 drop (has no administration in time range)  meclizine (ANTIVERT) tablet 25 mg (25 mg Oral Given 03/17/2022 1212)  sodium chloride 0.9 % bolus 1,000 mL (0 mLs Intravenous Stopped 03/11/2022 1543)  iohexol (OMNIPAQUE) 350 MG/ML injection 100 mL (75 mLs Intravenous Contrast Given 03/24/2022 1405)    Mobility walks Moderate fall risk   Focused Assessments Neuro Assessment Handoff:  Swallow screen pass? Yes  Cardiac Rhythm: Normal sinus rhythm NIH Stroke Scale ( + Modified Stroke Scale Criteria)  Interval: Initial Level of Consciousness (1a.)   : Alert, keenly responsive LOC Questions (1b. )   +: Answers both questions correctly LOC Commands (1c. )   + : Performs both tasks correctly Best Gaze (2. )  +: Normal Visual (3. )  +: No visual loss Facial Palsy (4. )    : Normal symmetrical movements Motor Arm, Left (5a. )   +: No drift Motor Arm, Right (5b. )   +: No drift Motor Leg, Left (6a. )   +: No drift Motor Leg, Right (6b. )   +: No drift Limb Ataxia (7. ): Absent Sensory (8. )   +: Normal, no sensory loss Best Language (9. )   +: No aphasia Dysarthria (10. ): Normal Extinction/Inattention (11.)   +: No Abnormality Modified SS Total  +: 0 Complete NIHSS TOTAL: 0     Neuro Assessment: Within Defined Limits Neuro  Checks:   Initial (03/09/2022 1540)  Last Documented NIHSS Modified Score: 0 (03/17/2022 1540) Has TPA been given? No If patient is a Neuro Trauma and patient is going to OR before floor call report to [facility] nurse: [phone] or [phone]   R Recommendations: See Admitting Provider Note  Report given to:   Additional Notes: a/ox4, uses urinal. NIH 0

## 2022-02-26 NOTE — Assessment & Plan Note (Addendum)
Smoking 1 pack/day for 30-40 years. - Nicotine patch 17 mg daily - Encourage smoking cessation - Provide quit line at discharge

## 2022-02-26 NOTE — ED Provider Notes (Signed)
[facility] EMERGENCY DEPARTMENT Provider Note   CSN: [id number] Arrival date & time: 03/15/2022  1137     History  Chief Complaint  Patient presents with   Dizziness    [name] is a 66 y.o. male.  Patient with history of stroke and high blood pressure who presents the ED with dizziness.  States that yesterday morning he had some right eye blurriness, possibly left lower leg weakness.  Had a couple of falls.  Did not think he lost consciousness.  He is on blood thinners.  Denies any neck pain or extremity pain.  He had a stroke in the past.  He lives in a group home.  Its been stressful living there.  He has been having some intermittent dizziness at times over the past month.  Does not have any weakness or blurry vision now.  States he has some chronic right upper arm weakness from previous stroke.  He has been having some difficulty walking around because he feels lightheaded at times.  Denies any chest pain or shortness of breath.  No numbness.   The history is provided by the patient.       Home Medications Prior to Admission medications   Medication Sig Start Date End Date Taking? Authorizing Provider  aspirin 325 MG tablet Take 325 mg by mouth daily.      [provider]  atorvastatin (LIPITOR) 20 MG tablet TAKE 1 TABLET(20 MG) BY MOUTH DAILY 08/14/21   Ezequiel Essex, MD  cetirizine (ZYRTEC) 10 MG tablet Take 1 tablet (10 mg total) by mouth daily. 12/04/21   Alcus Dad, MD  losartan-hydrochlorothiazide (HYZAAR) 50-12.5 MG tablet TAKE 1 TABLET BY MOUTH DAILY 12/01/21   Alcus Dad, MD      Allergies    Patient has no known allergies.    Review of Systems   Review of Systems  Physical Exam Updated Vital Signs BP (!) 156/117   Pulse 95   Temp 97.7 F (36.5 C) (Oral)   Resp (!) 21   SpO2 100%  Physical Exam Vitals and nursing note reviewed.  Constitutional:      General: He is not in acute distress.    Appearance: He is  well-developed. He is not ill-appearing.  HENT:     Head: Normocephalic and atraumatic.     Nose: Nose normal.     Mouth/Throat:     Mouth: Mucous membranes are moist.  Eyes:     Extraocular Movements: Extraocular movements intact.     Conjunctiva/sclera: Conjunctivae normal.     Pupils: Pupils are equal, round, and reactive to light.  Cardiovascular:     Rate and Rhythm: Normal rate and regular rhythm.     Pulses: Normal pulses.     Heart sounds: Normal heart sounds. No murmur heard. Pulmonary:     Effort: Pulmonary effort is normal. No respiratory distress.     Breath sounds: Normal breath sounds.  Abdominal:     Palpations: Abdomen is soft.     Tenderness: There is no abdominal tenderness.  Musculoskeletal:        General: No swelling or tenderness. Normal range of motion.     Cervical back: Normal range of motion and neck supple.     Comments: No midline spinal tenderness  Skin:    General: Skin is warm and dry.     Capillary Refill: Capillary refill takes less than 2 seconds.  Neurological:     General: No focal deficit present.  Mental Status: He is alert.     Comments: Trace weakness in the right upper extremity but otherwise 5+ out of 5 strength throughout but poor effort given, there is no obvious facial droop, no nystagmus, normal visual fields, normal finger-nose-finger, overall poor effort is given during neuro exam but does not appear to have any focal symptoms  Psychiatric:        Mood and Affect: Mood normal.     ED Results / Procedures / Treatments   Labs (all labs ordered are listed, but only abnormal results are displayed) Labs Reviewed  CBC WITH DIFFERENTIAL/PLATELET - Abnormal; Notable for the following components:      Result Value   RDW 16.1 (*)    All other components within normal limits  BASIC METABOLIC PANEL - Abnormal; Notable for the following components:   Potassium 3.2 (*)    Glucose, Bld 142 (*)    BUN 28 (*)    All other components  within normal limits  I-STAT CHEM 8, ED - Abnormal; Notable for the following components:   BUN 32 (*)    Glucose, Bld 140 (*)    All other components within normal limits    EKG EKG Interpretation  Date/Time:  Thursday February 26 2022 11:43:03 EDT Ventricular Rate:  89 PR Interval:  162 QRS Duration: 84 QT Interval:  364 QTC Calculation: 443 R Axis:   -41 Text Interpretation: Sinus rhythm Left axis deviation Confirmed by Lennice Sites ([id number]) on 03/16/2022 12:05:40 PM  Radiology MR BRAIN WO CONTRAST  Result Date: 02/27/2022 CLINICAL DATA:  Stroke suspected.  Dizziness.  Fall yesterday. EXAM: MRI HEAD WITHOUT CONTRAST TECHNIQUE: Multiplanar, multiecho pulse sequences of the brain and surrounding structures were obtained without intravenous contrast. COMPARISON:  CT Neck 05/02/21 FINDINGS: Brain: There are multiple supra and infratentorial acute infarcts including in the right cerebellar hemisphere and right occipital lobe. This is likely an additional recent infarct in the left thalamus with mild hypointense signal on the ADC map (series 5, image 76). There are additional chronic appearing infarcts in the left occipital lobe (series 11, image 12), left frontal lobe, left parietal lobe, and the right cerebellum (series 11, image 6). No evidence of acute hemorrhage. No hydrocephalus. No extra-axial fluid collection., Vascular: Absent flow void in the V3 and V4 segments of the right vertebral artery, new from prior CT neck. Skull and upper cervical spine: Normal marrow signal. Sinuses/Orbits: Mucosal thickening bilateral maxillary, ethmoid, frontal sinuses. There is an air-fluid level in the left maxillary sinus, which can be seen in the setting of acute sinusitis; correlate with symptomatology. Other: None. IMPRESSION: 1. Posterior circulation acute infarcts in the right cerebellar hemisphere and right occipital lobe, as well as the left thalamus. Asymmetricall absent flow void in the V3 and V4  segments of the right vertebral artery is worrisome for occlusion and/or dissection, new from CT neck 05/02/21. Recommend further evaluation with CTA. No hemorrhage. 2. Additional chronic infarcts in the left occipital lobe, left frontal lobe, left parietal lobe, and the right cerebellum. 3. Paranasal sinus disease with an air-fluid level in the left maxillary sinus, which can be seen in the setting of acute sinusitis. These results will be called to the ordering clinician or representative by the Radiologist Assistant, and communication documented in the PACS or [employer]. Electronically Signed   By: Marin Roberts M.D.   On: 03/10/2022 14:00    Procedures Procedures    Medications Ordered in ED Medications  meclizine (ANTIVERT) tablet 25  mg (25 mg Oral Given 03/04/2022 1212)  sodium chloride 0.9 % bolus 1,000 mL (1,000 mLs Intravenous New Bag/Given 03/21/2022 1213)  iohexol (OMNIPAQUE) 350 MG/ML injection 100 mL (75 mLs Intravenous Contrast Given 03/12/2022 1405)    ED Course/ Medical Decision Making/ A&P                           Medical Decision Making Amount and/or Complexity of Data Reviewed Labs: ordered. Radiology: ordered.  Risk Prescription drug management. Decision regarding hospitalization.   KRISTOPHER ATTWOOD is here with dizziness.  History of stroke, hypertension.  Normal vitals.  No fever.  EKG per my review and interpretation shows sinus rhythm.  No ischemic changes.  He states he has been having intermittent dizziness for the past month.  Worse with head movement.  Yesterday he had some blurred vision in his right eye, may be left lower leg weakness but felt gait and balance and difficulty walking in the last day.  He has some chronic back pain.  He is in a stressful living condition.  Does not have any stroke symptoms on exam.  Appears to have some trace weakness in his right upper extremity which she states is his baseline.  I do not appreciate any obvious weakness, numbness,  facial droop.  Overall exam seems very effort dependent.  He denies any alcohol or drug use.  Differential diagnosis is likely peripheral vertigo but he does have stroke risk factors.  He is got some worrisome symptoms for stroke and therefore we will get a CT angio head and neck as well as MRI.  We will check CBC, BMP.  We will give IV fluids, meclizine as I suspect may be a peripheral cause.  Could be stress related as well as he states he is living in a group home and does not get along well with the people live there.  Per my review and interpretation labs is no significant anemia, electrolyte abnormality, kidney injury.  MRI shows posterior circulation acute infarcts in the right cerebellar hemisphere and right occipital lobe as well as the left thalamus.  There is some asymmetric flow in the V3 V4 segments.  Possibly right vertebral artery occlusion or dissection.  CTA has been completed.  Waiting for report.  Have talked with Dr. Malen Gauze with neurology who recommends admission for stroke work-up.  Will admit to medicine.  This chart was dictated using voice recognition software.  Despite best efforts to proofread,  errors can occur which can change the documentation meaning.   Document critical care time when appropriate:1}      Final Clinical Impression(s) / ED Diagnoses Final diagnoses:  Cerebrovascular accident (CVA), unspecified mechanism [facility])    Rx / DC Orders ED Discharge Orders     None         Lennice Sites, DO 03/05/2022 1435

## 2022-02-26 NOTE — Assessment & Plan Note (Addendum)
BP near normotensive off of his Hyzaar. -Continue to hold home Hyzaar -If BP is persistently elevated can add back home medication. Out of permissive HTN window

## 2022-02-26 NOTE — Progress Notes (Signed)
I saw and examined [name].  I discussed with the admitting team.  We have agreed on a plan.  I will cosign the H&PE when available.  Briefly, 66 yo male with previous CVA (2012), tobacco abuse, seemingly controled hypertension and prediabetes presents with multiple acute post circulation TIAs presenting as balance problem and right leg weakness.   No palpitations.  No hx of a fib.  My cardiac exam does not suggest a cardiac source of emboli (of course, await echo.).  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  Came in on ASA and statin (not high intensity).  Await full work up and neuro testing.  I will be interested to see if he is typical DAPT or some other anticoag.

## 2022-02-26 NOTE — Progress Notes (Signed)
We were informed by nursing staff at 6:06p by secure chat that patient passed bed-side swallow. Diet was placed.

## 2022-02-26 NOTE — Consult Note (Signed)
Neurology Consultation  Reason for Consult: stroke Referring Physician: Dr Ronnald Nian  CC: dizziness  History is obtained from: Patient, chart  HPI: [name] is a 66 y.o. male prior history of stroke with residual right upper extremity weakness and clumsiness, and left upper and lower extremity weakness and clumsiness (2012 neurology note from Dr. Tyron Russell  reports multiple strokes at that time and left PCA, left MCA and right PCA territory-presented to the emergency room for evaluation of sudden onset of dizziness.  He [name] has been having ongoing dizziness for 5 to 6 months but it was 2 days ago when he woke up from sleep that he felt extremely dizzy and lightheaded along with feeling imbalanced or off balance and felt that his equilibrium was off.  Symptoms did not improve and that made him come to the hospital.  An MRI was done that revealed cerebellar infarctions for which neurological consultation was obtained. He reports that he has had prior strokes, the big one is 2012 after which she had difficulty with using his right arm and his left foot has been numb with some left-sided weakness.  Upon asking him if he had any residual left facial droop, he said that he sees that off-and-on and it has been present since 2012.  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  MRI brain with multiple acute/subacute infarcts in the posterior left temporal lobe, left occipital lobe, left caudate, left insular cortex, left frontal lobe, left parietal lobe along with right parietal lobe.   LKW: At least 2 days ago IV thrombolysis given?: no, outside window Premorbid modified Rankin scale (mRS): 2   ROS: Full ROS was performed and is negative except as noted in the HPI.   Past Medical History:  Diagnosis Date   Stroke [facility])      No family history on file.   Social History:   reports that he has been smoking cigarettes. He has never  used smokeless tobacco. He reports current alcohol use. He reports that he does not use drugs.  Medications No current facility-administered medications for this encounter.  Current Outpatient Medications:    aspirin 325 MG tablet, Take 325 mg by mouth daily.  , Disp: , Rfl:    atorvastatin (LIPITOR) 20 MG tablet, TAKE 1 TABLET(20 MG) BY MOUTH DAILY, Disp: 90 tablet, Rfl: 0   cetirizine (ZYRTEC) 10 MG tablet, Take 1 tablet (10 mg total) by mouth daily., Disp: 90 tablet, Rfl: 3   losartan-hydrochlorothiazide (HYZAAR) 50-12.5 MG tablet, TAKE 1 TABLET BY MOUTH DAILY, Disp: 90 tablet, Rfl: 0   Exam: Current vital signs: BP (!) 156/117   Pulse 95   Temp 97.7 F (36.5 C) (Oral)   Resp (!) 21   SpO2 100%  Vital signs in last 24 hours: Temp:  [97.7 F (36.5 C)] 97.7 F (36.5 C) (10/05 1144) Pulse Rate:  [84-95] 95 (10/05 1430) Resp:  [17-21] 21 (10/05 1430) BP: (138-167)/(81-117) 156/117 (10/05 1430) SpO2:  [96 %-100 %] 100 % (10/05 1430) General: Awake alert in no distress HEENT: Normocephalic atraumatic Lungs: Clear Cardiovascular: Regular rhythm Abdomen nondistended nontender Neurologic exam Awake alert oriented x3 No dysarthria No aphasia Cranial nerves: Pupils equal round react light, extraocular movements appear unhindered, visual fields are full, facial symmetry is mildly distorted with left lower facial weakness, tongue and palate midline. Motor examination with no frank vertical drift but mild weakness in the right hand grip in comparison to the left. Sensation:  Intact Coordination with dysmetria in both upper extremities and more pronounced ataxia in the left lower extremity and heel-knee-shin testing than right. NIHSS 1a Level of Conscious.: 0 1b LOC Questions: 0 1c LOC Commands: 0 2 Best Gaze: 0 3 Visual: 0 4 Facial Palsy: 1 5a Motor Arm - left: 0 5b Motor Arm - Right: 0 6a Motor Leg - Left: 0 6b Motor Leg - Right: 0 7 Limb Ataxia: 2 8 Sensory: 0 9 Best  Language: 0 10 Dysarthria: 0 11 Extinct. and Inatten.: 0 TOTAL: 3  Labs I have reviewed labs in epic and the results pertinent to this consultation are:  CBC    Component Value Date/Time   WBC 5.0 03/06/2022 1145   RBC 4.22 03/15/2022 1145   HGB 13.9 02/22/2022 1254   HCT 41.0 02/23/2022 1254   PLT 193 03/22/2022 1145   MCV 95.3 03/12/2022 1145   MCH 31.5 02/23/2022 1145   MCHC 33.1 03/06/2022 1145   RDW 16.1 (H) 03/22/2022 1145   LYMPHSABS 2.2 03/22/2022 1145   MONOABS 0.8 03/23/2022 1145   EOSABS 0.1 03/03/2022 1145   BASOSABS 0.0 02/27/2022 1145    CMP     Component Value Date/Time   NA 140 03/07/2022 1254   NA 142 11/04/2021 1423   K 3.5 02/24/2022 1254   CL 106 03/07/2022 1254   CO2 22 02/22/2022 1145   GLUCOSE 140 (H) 03/23/2022 1254   BUN 32 (H) 03/16/2022 1254   BUN 14 11/04/2021 1423   CREATININE 1.10 03/12/2022 1254   CALCIUM 9.6 03/06/2022 1145   PROT 7.8 02/18/2021 1111   ALBUMIN 4.3 02/18/2021 1111   AST 17 02/18/2021 1111   ALT 37 02/18/2021 1111   ALKPHOS 108 02/18/2021 1111   BILITOT 0.4 02/18/2021 1111   GFRNONAA >60 03/14/2022 1145   GFRAA >60 01/02/2016 1200    Lipid Panel     Component Value Date/Time   CHOL 102 02/18/2021 1111   TRIG 116 02/18/2021 1111   HDL 42 02/18/2021 1111   CHOLHDL 2.4 02/18/2021 1111   CHOLHDL 1.8 11/11/2010 0530   VLDL 20 11/11/2010 0530   LDLCALC 39 02/18/2021 1111     Imaging I have reviewed the images obtained:  MRI brain: Posterior circulation acute infarcts in the right cerebellar hemisphere and right occipital lobe as well as faint looking DWI abnormality in the left thalamus.  Asymmetrical absent flow void in the V3 and V4 segments of the right vertebral arteries worrisome for occlusion and or dissection and new from CT neck of 05/02/2021.  Recommended further evaluation by CT angiography. Additional chronic infarcts in the left occipital, left frontal, left parietal and right cerebellum.  CT  angiography was performed head and neck: IMPRESSION: 1. 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 The V4 segment of the non dominant left vertebral artery is also likely intermittently occluded. Basilar artery and bilateral P1 segments are contrast opacified. 2. Non opacification of the P2 and distal PCA branches bilaterally. 3. 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 4. 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   Assessment: 66 year old with multiple days of dizziness but worsening dizziness over the last 2 days with last known well 2 days ago now with multiple posterior circulation strokes. His posterior circulation including the basilar and vertebral arteries are  extremely atherosclerotic and have had progressive worsening atherosclerosis over the years. I suspect that the recent stroke might be related to an acute occlusion of the right vertebral artery. Needs stroke work-up  Outside the window for IV TNKase or endovascular intervention  Impression:  Posterior circulation strokes involving the right cerebellum, right occipital lobe and the left thalamus. Intracranial atherosclerosis, worse in posterior circulation and anterior circulation. Extracranial atherosclerosis  Recommendations: Admit to hospitalist Frequent neurochecks Telemetry Aspirin 325+ Plavix 75 for 3 months After 3 months, aspirin only. High intensity statin 2D echo A1c Lipid panel PT OT Speech therapy Permissive hypertension for another day or so-treat only if systolic blood pressures greater than 220 on a as needed basis.  Then start normalizing blood pressures to normotension on discharge.  Stroke team will follow.   -- Amie Portland, MD Neurologist [facility] Pager:  [phone]

## 2022-02-26 NOTE — Hospital Course (Addendum)
[name] is a 66 year old male who presented with dizziness and balance problems, and ultimately found to have several acute infarcts in posterior circulation, right cerebellar hemisphere, occipital lobes as well as left thalamus.  Dizziness is primarily believed to be secondary to CVA.  His hospital course is outlined below.  CVA Patient was symptomatic 10/4: fell, right leg weakness, right eye blurriness-he did not seek medical care.  10/5, after 24 hours, high blurriness and right leg weakness improved, however patient was dizzy and ataxic-he sought medical care.  In the ED he was worked up for dizziness.  MRI showed multiple acute infarcts in posterior circulation, right cerebellar hemisphere and left thalamus.  Additionally chronic infarcts throughout the brain.  No hemorrhagic CVA identified.  Follow-up CTA showed occlusion of right vertebral artery at origin.  Due to symptom onset being greater than 24 hours patient was out of window for tPA therapy, and was not considered to be a surgical candidate.  Medical management with dual antiplatelet therapy was started-Per neurology recommendations. He has several risk factors including HTN, smoking, diabetes, very low LDL (18), previous CVA 2012.  A1c showed worsening prediabetes, and LDL 15, UDS negative.  TSH was elevated, follow-up as outpatient.  Echo showed EF 45-50%, global hypokinesis and Grade 1 diastolic dysfuction. Given his low LDL, his statin dosage was reduced to 10 mg daily from his previous dose of 20 mg.  PT/OT recommended inpatient rehabilitation.  Per neurology we will treat with 90-day dual antiplatelet therapy (325 mg ASA, 75 mg Plavix), then Plavix monotherapy.  Patient was stable at time of discharge***  Dental abscess Incidental finding on CTA, odontogenic soft tissue abscess along left mandible measuring 1.5X 0.3 cm.  He was treated with 5 days of penicillin V (10/5 - 10/9).  He will need to follow-up with outpatient  dentistry.  Tobacco use Treated with nicotine patch during admission.  30-40 pack years.  Encouraged smoking cessation.  Follow-up outpatient for smoking cessation.  Quit line was provided at discharge.  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  Some drinking during weekdays.  There was some concern that patient may have alcohol withdrawal and was placed on CIWA protocol.  Patient CIWA scores remain below 3, never needed Ativan.  TOC was consulted for substance abuse counseling.  Follow-up as outpatient.  Essential hypertension Permissive hypertension for 24 hours was permitted.  Home blood pressure medications were held.  Patient's blood pressure remained below 150/110 during admission.  Restart blood pressure medications at discharge***.  Please follow-up with patient.  All other chronic medical conditions stable at time of discharge.  Follow-up instructions for PCP Follow-up on slightly elevated TSH (5.802) Please discuss substance use cessation including tobacco and alcohol Please assess for blood pressure control on current home regimen ***

## 2022-02-26 NOTE — Assessment & Plan Note (Addendum)
Still with intermittent dizziness and nausea/vomiting requiring anti-emetic overnight, likely sequela of CVA. Additionally, was made NPO due to difficulty swallowing and coughing- will need SLP eval. Neuro examination stable from 10/6. On DAPT with ASA 325 mg and Plavix 75 mg daily x3 months and then Plavix monotherapy per Neurology recommendations. Does not need high intensity statin given his low LDL and statin dose has been reduced. He has been recommended for CIR and is awaiting placement. Echo showed EF 45-50%, global hypokinesis, G1DD.  - Neurology has signed off. He should follow up outpatient in 4 weeks.  - NPO until SLP evaluation - DAPT: ASA 325, Plavix 75 mg X 90 days followed by Plavix monotherapy - Atorvastatin reduced to 10 mg given low LDL - Can start to normalize BP

## 2022-02-26 NOTE — Assessment & Plan Note (Addendum)
CIWA scores below 3 overnight. - CIWA protocol, without Ativan - Can d/c CIWA if remains low today - TOC consult for substance abuse counseling

## 2022-02-26 NOTE — Progress Notes (Signed)
FMTS Brief Progress Note  S: In to evaluate patient with Dr. Marcha Dutton. He reports he is doing well though still feeling dizzy. Was able to eat dinner. Denies any pain or weakness otherwise.   O: BP 135/88 (BP Location: Right Arm)   Pulse 87   Temp 98.4 F (36.9 C) (Oral)   Resp 18   Ht '5\' 7"'$  (1.702 m)   Wt 93.6 kg   SpO2 98%   BMI 32.32 kg/m   Gen: NAD, pleasant, laying down in bed, cooperative with examination Resp: Clear to anterior fields, normal effort on room air Card: RRR Neuro: No focal deficits, 4/5 strength b/l upper and lower extremities, no slurred speech of facial droop, able to answer questions appropriately  A/P: Acute CVA Stable Neurological exam without any new deficits. Advised him to ask for assistance when attempting to get out of bed due to his dizziness as he is a fall risk. -Continue current plan per H&P and Neurology recommendations  - Orders reviewed. Labs for AM ordered, which was adjusted as needed.   Sharion Settler, DO 03/18/2022, 9:58 PM PGY-3, [facility] Night Resident  Please page [phone] with questions.

## 2022-02-26 NOTE — ED Triage Notes (Signed)
Pt BIB [facility] c/o dizziness. Fall yesterday AM getting out of bed (pt denies taking thinners), pt felt as if his left leg was not working right, and had blurred vision at 1000. Sx have since subsided but continues to have dizziness. Per EMS stroke screen was negative. Ambulatory on scene but c/o dizziness. EKG showed ST with PVCs. CBG 145. BP 160/100.

## 2022-02-26 NOTE — Assessment & Plan Note (Addendum)
Asymptomatic, incidental finding on imaging. On abx, and should follow up with dentist outpatient. - Oral penicillin V 500 mg (10/5- 10/9)  - Consider orthopanogram if worsens clinically - Outpt dental follow up

## 2022-02-26 NOTE — H&P (Addendum)
Hospital Admission History and Physical Service Pager: [phone]  Patient name: [name] Medical record number: [id number] Date of Birth: 1955-10-09 Age: 66 y.o. Gender: male  Primary Care Provider: Alcus Dad, MD Consultants: Neuro Code Status: Full code Preferred Emergency Contact: Mother [name] [phone]  Chief Complaint: Dizziness  Assessment and Plan: [name] is a 66 y.o. male presenting with dizziness. Differential for presentation of this included peripheral cause (vertigo) vs. central cause (stroke/mass).  MRI showed posterior circulation acute infarcts in right cerebellar hemisphere occipital lobe as well as left thalamus.  Admit to [facility]MTS for stroke work-up.  * Cerebral vascular accident [facility]) Symptoms started 10/4 morning and included right lower extremity weakness and blurry vision-resolved today. Mild left lower facial droop on exam, no other neurological deficits appreciated. Risk factors including prediabetes, hypertension, smoking, prior CVA 2012.  MRI showed multiple acute (predominantly in right cerebellar hemisphere and right occipital lobe, as well as the left thalamus) and chronic infarcts, no active bleed (see below). 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  Neurology was consulted in ED.  Out of tPA treatment window.  Admit to [facility]MTS for CVA work-up. - Admit to progressive with cardiac monitoring, attending Dr. Andria Frames - Nursing vital signs per unit routine, out of bed with assistance only, neurochecks every 2 - Neurology consulted, appreciate recs - May need further intervention for vertebral artery occlusion-will f/u Neuro recs - PT/OT/SLP - A1c, lipid panel, TSH, UDS - Echo - ASA 325 mg today - Continue home atorvastatin 20 mg, consider increasing to high intensity - NPO until passes bedside swallow for SLP eval - Permissive hypertension, 24-hour window - Notify primary team if blood pressure > 220/110 - DVT ppx:  Lovenox  Dental abscess Dental abscess on CTA.  On exam patient has many dental caries and overall poor dentition. No obvious abscess on exam. Denies pain currently, no systemic symptoms such as fevers/vomiting.  We will treat and have patient follow-up outpatient dentistry. - Oral penicillin V 500 mg (10/6- 10/10) - Follow fever curve  - Consider orthopanogram if not improving - Outpt dental follow up  Tobacco use Smoking 1 pack/day for 30-40 years. 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  Reports some drinking during the week, but was unable to quantify.  He reports his last drink was on Sunday 10/1. - CIWA protocol, without Ativan (add ativan if CIWA high) - TOC consult for substance abuse counseling  Essential hypertension BP ranges have been 138-167/81-117. Has not taken his home antihypertensive today. -Hold home Hyzaar d/t permissive HTN   FEN/GI: N.p.o. until bedside swallow, transition to regular when appropriate. VTE Prophylaxis: Lovenox  Disposition: Progressive  History of Present Illness:  [name] is a 66 y.o. male presenting with dizziness and balance troubles. Yesterday morning he was walking by his neighbors bedroom and started coughing (he attributes this to the [facility] being on).  After coughing he heard " piercing noise," fell to the ground and could not get up.  Every time he tried to get up he fell back down.  He notes that his right leg was weak and numb, right eye had lots of blurriness.  He crawled into the bathroom, until he was able to eventually get up.  He did not seek medical attention at this time.  Today patient had headache, dizziness, balance issues.  Right lower extremity weakness had resolved, right vision problems had resolved.  He was concerned that he may be having a stroke, as  he has had a stroke in 2012.  He then sought medical care. Today lips, left foot, left side of tongue were numb, but this is resolved now. His CVA in 2012 left him with residual right arm and left leg weakness, as well as mild facial droop.  He reports he has had continued weakness in these extremities since 2012. It has been 24 hours since his symptom onset yesterday.  Only active symptom right now is mild headache.  In the ED, patient was worked up for dizziness.  EKG was normal.   Patient was given IV fluids and meclizine (originally thought dizziness due to vertigo). Patient did not have significant CVA symptoms, had trace weakness in the right upper extremity which is his baseline and mild left lower facial droop.  MRI was ordered and showed multiple acute and chronic infarcts, no active bleed.  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 Unremarkable CBC, hypokalemia on BMP.  Neurology was consulted and FMTS was consulted for admission.  Review Of Systems: Per HPI with the following additions:  No headache No vision No dysarthria No toothpain No fevers No vomiting +Nausea Chronic cough - 2-3 years No CP No shakes No palpitations No constipation or diarrhea No leg swelling  Pertinent Past Medical History: Prior CVA 2012, HTN, alcohol use, tobacco use, prediabetes  Remainder reviewed in history tab.   Pertinent Past Surgical History: No significant past surgical history  Remainder reviewed in history tab.  Pertinent Social History: Tobacco use: 1 PPD smoked for 30-40 years Alcohol use: Sunday or Monday last drink - 1/2 gallon of vodka on weekends-no hx of hospitalizations or hx of DTs Other Substance use: Nothing right now, no IVDU Lives alone-lives in rooming house  Pertinent Family History: Mom-diabetes Father-diabetes, HTN, stroke Sister-stroke Remainder reviewed in history tab.   Important Outpatient Medications: -Aspirin 325  daily -Eye drops as needed -Atorvastatin 20 mg--3 days  -Ibuprofen 200 mg as needed not every day -Losartan hydrochlorothiazide 50-12.5 mg tablet--took it yesterday not today Remainder reviewed in medication history.   Objective: BP (!) 139/92   Pulse 83   Temp 97.9 F (36.6 C)   Resp 16   SpO2 98%  Exam: General: Not in acute distress, pleasantly conversing, alert and oriented, responsive to all questions Eyes: EOM intact bilaterally, PERRL ENTM: Normocephalic/atraumatic head.  External nose and ear normal. Neck: Supple, normal range of motion Cardiovascular: Regular rate and rhythm, no MRG Respiratory: CTAB, normal work of breathing on room air Gastrointestinal: Bowel sounds present, no tenderness to palpation Derm: Dry, warm Neuro: CN II: PERRL CN III, IV,VI: EOMI CV V: Normal sensation in V1, V2, V3 CVII: Mild left lower facial droop.  Bilateral eyebrow raise normal. CN VIII: Normal hearing CN IX,X: Symmetric palate raise  CN XI: 5/5 shoulder shrug CN XII: Symmetric tongue protrusion  UE and LE strength 5/5 2+ UE and LE reflexes  Normal sensation in UE and LE bilaterally  No ataxia with finger to nose, normal heel to shin  Psych: Normal thought and behavior  Labs:  CBC BMET  Recent Labs  Lab 03/05/2022 1145 03/03/2022 1254  WBC 5.0  --   HGB 13.3 13.9  HCT 40.2 41.0  PLT 193  --    Recent Labs  Lab 03/05/2022 1145 03/11/2022  1254  NA 139 140  K 3.2* 3.5  CL 108 106  CO2 22  --   BUN 28* 32*  CREATININE 1.06 1.10  GLUCOSE 142* 140*  CALCIUM 9.6  --      EKG: Sinus rhythm, no evidence of ischemia no acute ST elevations  Imaging Studies Performed: MR BRAIN WO CONTRAST Result Date: 03/05/2022 IMPRESSION: 1. Posterior circulation acute infarcts in the right cerebellar hemisphere and right occipital lobe, as well as the left thalamus. Asymmetricall absent flow void in the V3 and V4 segments of the right vertebral artery is worrisome for occlusion and/or  dissection, new from CT neck 05/02/21. Recommend further evaluation with CTA. No hemorrhage. 2. Additional chronic infarcts in the left occipital lobe, left frontal lobe, left parietal lobe, and the right cerebellum. 3. Paranasal sinus disease with an air-fluid level in the left maxillary sinus, which can be seen in the setting of acute sinusitis. These results will be called to the ordering clinician or representative by the Radiologist Assistant, and communication documented in the PACS or [employer]. Electronically Signed   By: Marin Roberts M.D.   On: 03/13/2022 14:00   CT angio head neck Result date: 03/14/2022 Impression: 1.  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  The V4 segment of the nondominant left vertebral artery is also likely intermittently occluded.  Basilar artery and bilateral P1 segments are opacified. 2.  Nonopacification of P2 and distal PCA branches bilaterally. 3.  Extensive odontogenic disease with odontogenic soft tissue abscess along the left mandible measuring 1.5 x 0.3 cm. 4.  OPLL at see 6-C7 that likely causes severe spinal canal stenosis  Leslie Dales, DO 02/24/2022, 5:50 PM PGY-1, [place] Intern pager: [phone], text pages welcome Secure chat group [facility] Teaching Service   Upper Level Addendum:  I have seen and evaluated this patient along with Dr. Nelda Bucks and reviewed the above note, making necessary revisions as appropriate.  I agree with the medical decision making and physical exam as noted above.  Gerrit Heck, MD PGY-2 [facility]

## 2022-02-27 ENCOUNTER — Inpatient Hospital Stay (HOSPITAL_COMMUNITY): Payer: Medicare Other

## 2022-02-27 DIAGNOSIS — I6389 Other cerebral infarction: Secondary | ICD-10-CM | POA: Diagnosis not present

## 2022-02-27 DIAGNOSIS — Z8673 Personal history of transient ischemic attack (TIA), and cerebral infarction without residual deficits: Secondary | ICD-10-CM | POA: Diagnosis not present

## 2022-02-27 DIAGNOSIS — I63531 Cerebral infarction due to unspecified occlusion or stenosis of right posterior cerebral artery: Secondary | ICD-10-CM | POA: Diagnosis not present

## 2022-02-27 DIAGNOSIS — K047 Periapical abscess without sinus: Secondary | ICD-10-CM | POA: Diagnosis not present

## 2022-02-27 DIAGNOSIS — Z720 Tobacco use: Secondary | ICD-10-CM

## 2022-02-27 DIAGNOSIS — F1010 Alcohol abuse, uncomplicated: Secondary | ICD-10-CM | POA: Diagnosis not present

## 2022-02-27 DIAGNOSIS — I679 Cerebrovascular disease, unspecified: Secondary | ICD-10-CM

## 2022-02-27 DIAGNOSIS — I10 Essential (primary) hypertension: Secondary | ICD-10-CM | POA: Diagnosis not present

## 2022-02-27 DIAGNOSIS — I639 Cerebral infarction, unspecified: Secondary | ICD-10-CM | POA: Diagnosis not present

## 2022-02-27 LAB — LIPID PANEL
Cholesterol: 83 mg/dL (ref 0–200)
HDL: 33 mg/dL — ABNORMAL LOW (ref >40)
LDL Cholesterol: 15 mg/dL (ref 0–99)
Total CHOL/HDL Ratio: 2.5 RATIO
Triglycerides: 173 mg/dL — ABNORMAL HIGH (ref <150)
VLDL: 35 mg/dL (ref 0–40)

## 2022-02-27 LAB — BASIC METABOLIC PANEL
Anion gap: 11 (ref 5–15)
BUN: 17 mg/dL (ref 8–23)
CO2: 23 mmol/L (ref 22–32)
Calcium: 10.1 mg/dL (ref 8.9–10.3)
Chloride: 106 mmol/L (ref 98–111)
Creatinine, Ser: 1.05 mg/dL (ref 0.61–1.24)
GFR, Estimated: >60 mL/min (ref >60)
Glucose, Bld: 108 mg/dL — ABNORMAL HIGH (ref 70–99)
Potassium: 3.4 mmol/L — ABNORMAL LOW (ref 3.5–5.1)
Sodium: 140 mmol/L (ref 135–145)

## 2022-02-27 LAB — HEMOGLOBIN A1C
Hgb A1c MFr Bld: 6.3 % — ABNORMAL HIGH (ref 4.8–5.6)
Mean Plasma Glucose: 134.11 mg/dL

## 2022-02-27 LAB — CBC
HCT: 38.8 % — ABNORMAL LOW (ref 39.0–52.0)
Hemoglobin: 13.6 g/dL (ref 13.0–17.0)
MCH: 32.9 pg (ref 26.0–34.0)
MCHC: 35.1 g/dL (ref 30.0–36.0)
MCV: 93.7 fL (ref 80.0–100.0)
Platelets: 194 10*3/uL (ref 150–400)
RBC: 4.14 MIL/uL — ABNORMAL LOW (ref 4.22–5.81)
RDW: 16 % — ABNORMAL HIGH (ref 11.5–15.5)
WBC: 4.4 10*3/uL (ref 4.0–10.5)
nRBC: 0 % (ref 0.0–0.2)

## 2022-02-27 LAB — ECHOCARDIOGRAM COMPLETE
Area-P 1/2: 3.99 cm2
Calc EF: 47 %
Height: 67 in
S' Lateral: 2.9 cm
Single Plane A2C EF: 47.8 %
Single Plane A4C EF: 47.5 %
Weight: 3301.61 oz

## 2022-02-27 LAB — TSH: TSH: 5.802 u[IU]/mL — ABNORMAL HIGH (ref 0.350–4.500)

## 2022-02-27 MED ORDER — ATORVASTATIN CALCIUM 10 MG PO TABS
10.0000 mg | ORAL_TABLET | Freq: Every day | ORAL | Status: DC
  Administered 2022-02-28: 10 mg via ORAL
  Filled 2022-02-27: qty 1

## 2022-02-27 MED ORDER — METOCLOPRAMIDE HCL 5 MG/ML IJ SOLN
10.0000 mg | Freq: Once | INTRAMUSCULAR | Status: AC
  Administered 2022-02-27: 10 mg via INTRAVENOUS
  Filled 2022-02-27: qty 2

## 2022-02-27 MED ORDER — MECLIZINE HCL 25 MG PO TABS
25.0000 mg | ORAL_TABLET | Freq: Once | ORAL | Status: AC
  Administered 2022-02-27: 25 mg via ORAL
  Filled 2022-02-27: qty 1

## 2022-02-27 MED ORDER — POTASSIUM CHLORIDE CRYS ER 20 MEQ PO TBCR
20.0000 meq | EXTENDED_RELEASE_TABLET | Freq: Once | ORAL | Status: AC
  Administered 2022-02-27: 20 meq via ORAL
  Filled 2022-02-27: qty 1

## 2022-02-27 NOTE — Progress Notes (Signed)
STROKE TEAM PROGRESS NOTE   SUBJECTIVE (INTERVAL HISTORY) No family is at the bedside.  Overall his condition is gradually improving.  Patient lying in bed, stated that he still feels dizzy and imbalance when he got up work with PT.  Still has ataxia on exam.  PT/OT recommend CIR.  Smoke cessation education provided.   OBJECTIVE Temp:  [97.6 F (36.4 C)-99 F (37.2 C)] 98.4 F (36.9 C) (10/06 1604) Pulse Rate:  [83-95] 95 (10/06 1604) Cardiac Rhythm: Normal sinus rhythm (10/06 0900) Resp:  [13-19] 17 (10/06 1604) BP: (118-150)/(69-113) 131/113 (10/06 1604) SpO2:  [98 %-100 %] 100 % (10/06 1604) Weight:  [93.6 kg] 93.6 kg (10/05 2111)  No results for input(s): "GLUCAP" in the last 168 hours. Recent Labs  Lab 03/14/2022 1145 03/10/2022 1254 02/27/22 0445  NA 139 140 140  K 3.2* 3.5 3.4*  CL 108 106 106  CO2 22  --  23  GLUCOSE 142* 140* 108*  BUN 28* 32* 17  CREATININE 1.06 1.10 1.05  CALCIUM 9.6  --  10.1   No results for input(s): "AST", "ALT", "ALKPHOS", "BILITOT", "PROT", "ALBUMIN" in the last 168 hours. Recent Labs  Lab 03/15/2022 1145 02/27/2022 1254 02/27/22 0445  WBC 5.0  --  4.4  NEUTROABS 1.9  --   --   HGB 13.3 13.9 13.6  HCT 40.2 41.0 38.8*  MCV 95.3  --  93.7  PLT 193  --  194   No results for input(s): "CKTOTAL", "CKMB", "CKMBINDEX", "TROPONINI" in the last 168 hours. No results for input(s): "LABPROT", "INR" in the last 72 hours. No results for input(s): "COLORURINE", "LABSPEC", "PHURINE", "GLUCOSEU", "HGBUR", "BILIRUBINUR", "KETONESUR", "PROTEINUR", "UROBILINOGEN", "NITRITE", "LEUKOCYTESUR" in the last 72 hours.  Invalid input(s): "APPERANCEUR"     Component Value Date/Time   CHOL 83 02/27/2022 0445   CHOL 102 02/18/2021 1111   TRIG 173 (H) 02/27/2022 0445   HDL 33 (L) 02/27/2022 0445   HDL 42 02/18/2021 1111   CHOLHDL 2.5 02/27/2022 0445   VLDL 35 02/27/2022 0445   LDLCALC 15 02/27/2022 0445   LDLCALC 39 02/18/2021 1111   Lab Results  Component  Value Date   HGBA1C 6.3 (H) 02/27/2022      Component Value Date/Time   LABOPIA NONE DETECTED 03/05/2022 2257   COCAINSCRNUR NONE DETECTED 03/08/2022 2257   LABBENZ NONE DETECTED 03/19/2022 2257   AMPHETMU NONE DETECTED 03/19/2022 2257   THCU NONE DETECTED 03/10/2022 2257   LABBARB NONE DETECTED 02/25/2022 2257    No results for input(s): "ETH" in the last 168 hours.  I have personally reviewed the radiological images below and agree with the radiology interpretations.  ECHOCARDIOGRAM COMPLETE  Result Date: 02/27/2022    ECHOCARDIOGRAM REPORT   Patient Name:   THUAN TIPPETT Date of Exam: 02/27/2022 Medical Rec #:  [id number]       Height:       67.0 in Accession #:    [id number]      Weight:       206.3 lb Date of Birth:  07-13-1955       BSA:          2.049 m Patient Age:    66 years        BP:           118/69 mmHg Patient Gender: M               HR:           73 bpm.  Exam Location:  Inpatient Procedure: 2D Echo, 3D Echo, Cardiac Doppler and Color Doppler Indications:     Stroke  History:         Patient has no prior history of Echocardiogram examinations.                  Stroke; Risk Factors:Current Smoker and Hypertension.  Sonographer:     Greer Pickerel Sonographer#2:   Bernadene Person RDCS Referring Phys:  Princella Ion A HENSEL Diagnosing Phys: Gwyndolyn Kaufman MD  Sonographer Comments: Image acquisition challenging due to respiratory motion. IMPRESSIONS  1. Left ventricular ejection fraction, by estimation, is 45 to 50%. Left ventricular ejection fraction by 3D volume is 47 %. The left ventricle has mildly decreased function. The left ventricle demonstrates global hypokinesis. There is mild concentric left ventricular hypertrophy. Left ventricular diastolic parameters are consistent with Grade I diastolic dysfunction (impaired relaxation).  2. Right ventricular systolic function is normal. The right ventricular size is normal. Tricuspid regurgitation signal is inadequate for assessing PA  pressure.  3. The mitral valve is grossly normal. Mild mitral valve regurgitation.  4. The aortic valve is tricuspid. Aortic valve regurgitation is not visualized. Aortic valve sclerosis is present, with no evidence of aortic valve stenosis.  5. The inferior vena cava is normal in size with <50% respiratory variability, suggesting right atrial pressure of 8 mmHg. Conclusion(s)/Recommendation(s): No intracardiac source of embolism detected on this transthoracic study. Consider a transesophageal echocardiogram to exclude cardiac source of embolism if clinically indicated. FINDINGS  Left Ventricle: Left ventricular ejection fraction, by estimation, is 45 to 50%. Left ventricular ejection fraction by 3D volume is 47 %. The left ventricle has mildly decreased function. The left ventricle demonstrates global hypokinesis. The left ventricular internal cavity size was normal in size. There is mild concentric left ventricular hypertrophy. Left ventricular diastolic parameters are consistent with Grade I diastolic dysfunction (impaired relaxation). Right Ventricle: The right ventricular size is normal. No increase in right ventricular wall thickness. Right ventricular systolic function is normal. Tricuspid regurgitation signal is inadequate for assessing PA pressure. Left Atrium: Left atrial size was normal in size. Right Atrium: Right atrial size was normal in size. Pericardium: There is no evidence of pericardial effusion. Mitral Valve: The mitral valve is grossly normal. Mild mitral valve regurgitation. Tricuspid Valve: The tricuspid valve is normal in structure. Tricuspid valve regurgitation is trivial. Aortic Valve: The aortic valve is tricuspid. Aortic valve regurgitation is not visualized. Aortic valve sclerosis is present, with no evidence of aortic valve stenosis. Pulmonic Valve: The pulmonic valve was normal in structure. Pulmonic valve regurgitation is trivial. Aorta: The aortic root and ascending aorta are  structurally normal, with no evidence of dilitation. Venous: The inferior vena cava is normal in size with less than 50% respiratory variability, suggesting right atrial pressure of 8 mmHg. IAS/Shunts: The atrial septum is grossly normal.  LEFT VENTRICLE PLAX 2D LVIDd:         4.20 cm         Diastology LVIDs:         2.90 cm         LV e' medial:    5.29 cm/s LV PW:         1.20 cm         LV E/e' medial:  15.7 LV IVS:        1.20 cm         LV e' lateral:   6.89 cm/s LVOT diam:  2.00 cm         LV E/e' lateral: 12.0 LV SV:         58 LV SV Index:   28 LVOT Area:     3.14 cm        3D Volume EF                                LV 3D EF:    Left                                             ventricul LV Volumes (MOD)                            ar LV vol d, MOD    95.2 ml                    ejection A2C:                                        fraction LV vol d, MOD    109.0 ml                   by 3D A4C:                                        volume is LV vol s, MOD    49.7 ml                    47 %. A2C: LV vol s, MOD    57.2 ml A4C:                           3D Volume EF: LV SV MOD A2C:   45.5 ml       3D EF:        47 % LV SV MOD A4C:   109.0 ml      LV EDV:       108 ml LV SV MOD BP:    48.4 ml       LV ESV:       57 ml                                LV SV:        51 ml RIGHT VENTRICLE RV S prime:     10.80 cm/s TAPSE (M-mode): 1.8 cm LEFT ATRIUM             Index        RIGHT ATRIUM           Index LA diam:        3.60 cm 1.76 cm/m   RA Area:     16.80 cm LA Vol (A2C):   55.0 ml 26.84 ml/m  RA Volume:   49.40 ml  24.11 ml/m LA Vol (A4C):   54.8 ml 26.74 ml/m LA Biplane Vol: 55.8 ml 27.23 ml/m  AORTIC VALVE  LVOT Vmax:   91.70 cm/s LVOT Vmean:  61.300 cm/s LVOT VTI:    0.185 m  AORTA Ao Root diam: 3.60 cm Ao Asc diam:  3.40 cm MITRAL VALVE MV Area (PHT): 3.99 cm    SHUNTS MV Decel Time: 190 msec    Systemic VTI:  0.18 m MV E velocity: 82.80 cm/s  Systemic Diam: 2.00 cm Gwyndolyn Kaufman MD Electronically  signed by Gwyndolyn Kaufman MD Signature Date/Time: 02/27/2022/12:32:09 PM    Final (Updated)    CT ANGIO HEAD NECK W WO CM  Result Date: 03/19/2022 CLINICAL DATA:  Stroke suspected. EXAM: CT ANGIOGRAPHY HEAD AND NECK TECHNIQUE: Multidetector CT imaging of the head and neck was performed using the standard protocol during bolus administration of intravenous contrast. Multiplanar CT image reconstructions and MIPs were obtained to evaluate the vascular anatomy. Carotid stenosis measurements (when applicable) are obtained utilizing NASCET criteria, using the distal internal carotid diameter as the denominator. RADIATION DOSE REDUCTION: This exam was performed according to the departmental dose-optimization program which includes automated exposure control, adjustment of the mA and/or kV according to patient size and/or use of iterative reconstruction technique. CONTRAST:  86m OMNIPAQUE IOHEXOL 350 MG/ML SOLN COMPARISON:  MRA 11/11/10 FINDINGS: CT HEAD FINDINGS Brain: Infarcts seen on same day MRI are not visualized on this exam. There is no evidence of hemorrhage. No extra-axial fluid collection. No hydrocephalus. Vascular: See below for vascular findings Skull: Normal. Negative for fracture or focal lesion. Sinuses/Orbits: There is polypoid mucosal thickening in the right maxillary sinus and an air-fluid level in the left maxillary sinus. There is a chronic fracture of the left lamina papyracea. There is also mucosal thickening involving the bilateral frontal sinuses. Other: There is extensive odontogenic disease with a soft tissue abscess along the left mandible measuring 1.5 x 0.3 cm (series 100, image 208). Review of the MIP images confirms the above findings CTA NECK FINDINGS Aortic arch: 2 vessel aortic arch. The proximal aspect of the branch vessels are poorly assessed due to streak artifact from contrast bolus in the subclavian vein imaged portion shows no evidence of aneurysm or dissection. No significant  stenosis of the major arch vessel origins. Right carotid system: No evidence of dissection, stenosis (50% or greater), or occlusion. Left carotid system: No evidence of dissection, stenosis (50% or greater), or occlusion. Vertebral arteries: The right vertebral artery is occluded at the origin. There is intermittent opacification in the V2 segment. There is opacification at the V4 and V3 segments. The non dominant left vertebral artery is contrast opacified to the V 3 segment. The V4 segment is small in caliber and may be intermittently occluded (series 11, image 148). 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 Other neck: Negative. Upper chest: Negative. Review of the MIP images confirms the above findings CTA HEAD FINDINGS Anterior circulation: No significant stenosis, proximal occlusion, aneurysm, or vascular malformation. Posterior circulation: P1 segments of bilateral PCAs are contrast opacified. There is non opacification of the P2 and distal PCA branches bilaterally. Venous sinuses: As permitted by contrast timing, patent. Anatomic variants: None Review of the MIP images confirms the above findings IMPRESSION: 1. 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 The V4 segment of the non dominant left vertebral artery is also likely intermittently occluded. Basilar artery and bilateral P1 segments are contrast opacified. 2. Non opacification of the P2 and distal  PCA branches bilaterally. 3. 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 4. 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 Findings were discussed with Dr. Ronnald Nian on 03/19/2022 at 2:49 PM. Electronically Signed   By: Marin Roberts M.D.   On: 02/24/2022 14:55   MR BRAIN WO CONTRAST  Result Date: 02/25/2022 CLINICAL  DATA:  Stroke suspected.  Dizziness.  Fall yesterday. EXAM: MRI HEAD WITHOUT CONTRAST TECHNIQUE: Multiplanar, multiecho pulse sequences of the brain and surrounding structures were obtained without intravenous contrast. COMPARISON:  CT Neck 05/02/21 FINDINGS: Brain: There are multiple supra and infratentorial acute infarcts including in the right cerebellar hemisphere and right occipital lobe. This is likely an additional recent infarct in the left thalamus with mild hypointense signal on the ADC map (series 5, image 76). There are additional chronic appearing infarcts in the left occipital lobe (series 11, image 12), left frontal lobe, left parietal lobe, and the right cerebellum (series 11, image 6). No evidence of acute hemorrhage. No hydrocephalus. No extra-axial fluid collection., Vascular: Absent flow void in the V3 and V4 segments of the right vertebral artery, new from prior CT neck. Skull and upper cervical spine: Normal marrow signal. Sinuses/Orbits: Mucosal thickening bilateral maxillary, ethmoid, frontal sinuses. There is an air-fluid level in the left maxillary sinus, which can be seen in the setting of acute sinusitis; correlate with symptomatology. Other: None. IMPRESSION: 1. Posterior circulation acute infarcts in the right cerebellar hemisphere and right occipital lobe, as well as the left thalamus. Asymmetricall absent flow void in the V3 and V4 segments of the right vertebral artery is worrisome for occlusion and/or dissection, new from CT neck 05/02/21. Recommend further evaluation with CTA. No hemorrhage. 2. Additional chronic infarcts in the left occipital lobe, left frontal lobe, left parietal lobe, and the right cerebellum. 3. Paranasal sinus disease with an air-fluid level in the left maxillary sinus, which can be seen in the setting of acute sinusitis. These results will be called to the ordering clinician or representative by the Radiologist Assistant, and communication documented in the  PACS or [employer]. Electronically Signed   By: Marin Roberts M.D.   On: 03/18/2022 14:00     PHYSICAL EXAM  Temp:  [97.6 F (36.4 C)-99 F (37.2 C)] 98.4 F (36.9 C) (10/06 1604) Pulse Rate:  [83-95] 95 (10/06 1604) Resp:  [13-19] 17 (10/06 1604) BP: (118-150)/(69-113) 131/113 (10/06 1604) SpO2:  [98 %-100 %] 100 % (10/06 1604) Weight:  [93.6 kg] 93.6 kg (10/05 2111)  General - Well nourished, well developed, in no apparent distress.  Ophthalmologic - fundi not visualized due to noncooperation.  Cardiovascular - Regular rhythm and rate.  Mental Status -  Level of arousal and orientation to time, place, and person were intact. Language including expression, naming, repetition, comprehension was assessed and found intact.  Mild dysarthria Fund of Knowledge was assessed and was intact.  Cranial Nerves II - XII - II - Visual field intact OU. III, IV, VI - Extraocular movements intact. V - Facial sensation intact bilaterally. VII - mild left facial droop VIII - Hearing & vestibular intact bilaterally.  No nystagmus. X - Palate elevates symmetrically. XI - Chin turning & shoulder shrug intact bilaterally. XII - Tongue protrusion intact.  Motor Strength - The patient's strength was normal in all extremities and pronator drift was absent.  Bulk was normal and fasciculations were absent.   Motor Tone - Muscle tone  was assessed at the neck and appendages and was normal.  Reflexes - The patient's reflexes were symmetrical in all extremities and he had no pathological reflexes.  Sensory - Light touch, temperature/pinprick were assessed and were symmetrical.    Coordination - The patient had ataxia on right finger-to-nose, left finger-nose and bilateral heel-to-shin dysmetria.  Tremor was absent.  Gait and Station - deferred.   ASSESSMENT/PLAN Mr. CLEMENCE STILLINGS is a 66 y.o. male with history of stroke and smoker admitted for dizziness, imbalance.  Had a similar episode 1  month ago lasted only few minutes.  No tPA given due to outside window.    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 source with right VA and P2 occlusion CT no acute abnormality MRI showed scattered right cerebellum and right PCA small infarcts CTA head and neck showed right MCA occlusion with V2/V3 reconstitution, left V4 intermittently occluded, bilateral P2 occlusion 2D Echo EF 45 to 50% LDL 15 HgbA1c 6.3 UDS negative Lovenox for VTE prophylaxis aspirin 325 mg daily prior to admission, now on aspirin 325 mg daily and clopidogrel 75 mg daily DAPT for 3 months and then Plavix alone given severe intracranial stenosis/occlusion Patient counseled to be compliant with his antithrombotic medications Ongoing aggressive stroke risk factor management Therapy recommendations: CIR Disposition: Pending  History of stroke 10/2010 admitted for right-sided weakness, MRI showed stroke at left MCA and bilateral ACA.  MRI showed left VA occlusion.  Discharged on aspirin  Hypertension Stable Long term BP goal normotensive  Hyperlipidemia Home meds: Lipitor 20 LDL 15, goal < 70 Now decrease Lipitor to 10, no high intensity statin given low LDL Continue statin at discharge  Tobacco abuse Current smoker Smoking cessation counseling provided Nicotine patch provided Pt is willing to quit  Other Stroke Risk Factors Advanced age Obesity, Body mass index is 32.32 kg/m.   Other [facility] day # 1  Neurology will sign off. Please call with questions. Pt will follow up with stroke clinic NP at [facility] in about 4 weeks. Thanks for the consult.   Rosalin Hawking, MD PhD Stroke Neurology 02/27/2022 6:29 PM    To contact Stroke Continuity provider, please refer to [email or web address] After hours, contact General Neurology

## 2022-02-27 NOTE — Evaluation (Signed)
Occupational Therapy Evaluation Patient Details Name: [name] MRN: [id number] DOB: 05/22/1956 Today's Date: 02/27/2022   History of Present Illness Pt is a 66 y/o male presenting on 10/5 with dizziness. 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 PMH includes: CVA 2012, HTN, alcohol/tobacco use, prediabetes.   Clinical Impression   PTA patient independent with ADLs, mobility, IADLs (but not driving). Admitted for above and presents with problem list below, including impaired balance, R sided weakness and impaired coordination, dizziness (with nausea and vomiting sitting at EOB), impaired vision, and decreased activity tolerance. Patient with mild R sided blurry vision, nystagmus with rolling in bed.  Completing focus gaze, head then body turn for bed mobility; requires max cueing to maintain focused gaze- +2 min assist for bed mobility but became nauseated and began vomiting at EOB limiting session.  Supine mod to max assist for ADLs.  Will follow acutely, recommend AIR at dc to optimize independence and return to PLOF.    Recommendations for follow up therapy are one component of a multi-disciplinary discharge planning process, led by the attending physician.  Recommendations may be updated based on patient status, additional functional criteria and insurance authorization.   Follow Up Recommendations  Acute inpatient rehab (3hours/day)    Assistance Recommended at Discharge Frequent or constant Supervision/Assistance  Patient can return home with the following A lot of help with walking and/or transfers;A lot of help with bathing/dressing/bathroom;Assistance with cooking/housework;Direct supervision/assist for medications management;Direct supervision/assist for financial management;Assist for transportation;Help with stairs or ramp for entrance    Functional Status Assessment  Patient has had a recent decline in their  functional status and demonstrates the ability to make significant improvements in function in a reasonable and predictable amount of time.  Equipment Recommendations  Other (comment) (defer)    Recommendations for Other Services Rehab consult     Precautions / Restrictions Precautions Precautions: Fall Restrictions Weight Bearing Restrictions: No      Mobility Bed Mobility Overal bed mobility: Needs Assistance Bed Mobility: Rolling, Sidelying to Sit, Sit to Sidelying Rolling: Min guard Sidelying to sit: +2 for safety/equipment, Min assist     Sit to sidelying: Min assist General bed mobility comments: pt transitioned from sidelying to sitting EOB with min assist +2 to trunk support. Limited session due to nausea and vomiting once EOB.  returned to sidelying with min assist.  throughout session worked on focused gaze, turning head before body.    Transfers                   General transfer comment: deferred      Balance Overall balance assessment: Needs assistance Sitting-balance support: No upper extremity supported, Feet supported Sitting balance-Leahy Scale: Fair Sitting balance - Comments: min guard to min assist statically but limited at EOB                                   ADL either performed or assessed with clinical judgement   ADL Overall ADL's : Needs assistance/impaired     Grooming: Set up;Bed level Grooming Details (indicate cue type and reason): washing face         Upper Body Dressing : Bed level;Moderate assistance   Lower Body Dressing: Maximal assistance;Bed level                 General ADL Comments: limited session due to  nausea and vomiting at EOB     Vision Baseline Vision/History: 1 Wears glasses (reading) Ability to See in Adequate Light: 0 Adequate Patient Visual Report: No change from baseline Vision Assessment?: Yes Eye Alignment: Within Functional Limits Ocular Range of Motion: Within Functional  Limits Alignment/Gaze Preference: Within Defined Limits Tracking/Visual Pursuits: Able to track stimulus in all quads without difficulty Additional Comments: pt with [facility] visual testing supine in bed, able to read clock. Reports R eye is slightly more blurry when testing individually.  Nystagumus noted with rolling in bed.  Worked on focused gaze. Continue assessment.     Perception     Praxis      Pertinent Vitals/Pain Pain Assessment Pain Assessment: Faces Faces Pain Scale: Hurts little more Pain Location: back- chronic Pain Descriptors / Indicators: Discomfort Pain Intervention(s): Limited activity within patient's tolerance, Monitored during session, Repositioned     Hand Dominance Right   Extremity/Trunk Assessment Upper Extremity Assessment Upper Extremity Assessment: RUE deficits/detail RUE Deficits / Details: generally weaker than L UE, MMT 3+/5 with decreased coordination (FM and GM) RUE Sensation: WNL RUE Coordination: decreased fine motor;decreased gross motor   Lower Extremity Assessment Lower Extremity Assessment: Defer to PT evaluation   Cervical / Trunk Assessment Cervical / Trunk Assessment: Normal   Communication Communication Communication: No difficulties   Cognition Arousal/Alertness: Awake/alert Behavior During Therapy: WFL for tasks assessed/performed Overall Cognitive Status: Within Functional Limits for tasks assessed                                 General Comments: appears WFL, continue assessment     General Comments  RN notified of nausea/vomiting    Exercises     Shoulder Instructions      Home Living Family/patient expects to be discharged to:: Other (Comment) ("rooming" house) Living Arrangements: Non-relatives/Friends   Type of Home: House Home Access: Ramped entrance;Stairs to enter Technical brewer of Steps: 3 Entrance Stairs-Rails: Can reach both Home Layout: One level     Bathroom Shower/Tub:  Tub/shower unit;Walk-in shower   Bathroom Toilet: Standard     Home Equipment: None   Additional Comments: 4 room- 3 roomates      Prior Functioning/Environment Prior Level of Function : Independent/Modified Independent               ADLs Comments: manages ADLs, IADLs (meds); uses community transportation and commuinty healthcare        OT Problem List: Decreased strength;Decreased activity tolerance;Impaired balance (sitting and/or standing);Impaired vision/perception;Decreased coordination;Decreased safety awareness;Decreased knowledge of use of DME or AE;Decreased knowledge of precautions;Impaired UE functional use      OT Treatment/Interventions: Self-care/ADL training;Therapeutic exercise;DME and/or AE instruction;Therapeutic activities;Visual/perceptual remediation/compensation;Patient/family education;Balance training;Neuromuscular education    OT Goals(Current goals can be found in the care plan section) Acute Rehab OT Goals Patient Stated Goal: get better OT Goal Formulation: With patient Time For Goal Achievement: 03/13/22 Potential to Achieve Goals: Good  OT Frequency: Min 2X/week    Co-evaluation PT/OT/SLP Co-Evaluation/Treatment: Yes Reason for Co-Treatment: For patient/therapist safety;To address functional/ADL transfers   OT goals addressed during session: ADL's and self-care      AM-PAC OT "6 Clicks" Daily Activity     Outcome Measure Help from another person eating meals?: A Little Help from another person taking care of personal grooming?: A Little Help from another person toileting, which includes using toliet, bedpan, or urinal?: A Lot Help from another person bathing (including washing,  rinsing, drying)?: A Lot Help from another person to put on and taking off regular upper body clothing?: A Lot Help from another person to put on and taking off regular lower body clothing?: A Lot 6 Click Score: 14   End of Session Nurse Communication: Mobility  status  Activity Tolerance: Patient tolerated treatment well Patient left: in bed;with call bell/phone within reach;with bed alarm set;Other (comment) (MD present)  OT Visit Diagnosis: Other abnormalities of gait and mobility (R26.89);Muscle weakness (generalized) (M62.81);Dizziness and giddiness (R42)                Time: 6269-4854 OT Time Calculation (min): 37 min Charges:  OT General Charges $OT Visit: 1 Visit OT Evaluation $OT Eval Moderate Complexity: [place], OT Acute Rehabilitation Services Office [phone]   Delight Stare 02/27/2022, 10:57 AM

## 2022-02-27 NOTE — TOC Initial Note (Signed)
Transition of Care [facility]) - Initial/Assessment Note    Patient Details  Name: [name] MRN: [id number] Date of Birth: [birth year]  Transition of [facility]) CM/SW Contact:    Pollie Friar, RN Phone Number: 02/27/2022, 11:49 AM  Clinical Narrative:                 Pt is from a boarding house. He doesn't have anyone at the home that can assist him. He does have a mother and sister that are local but they also can not provide any physical assistance.  No DME at home.  Pt manages his own medications and denies any issues.  Pt uses lyft/ uber for transportation.  Recommendations are for CIR. Awaiting eval.  TOC following.  Expected Discharge Plan: IP Rehab Facility Barriers to Discharge: Continued Medical Work up   Patient Goals and CMS Choice   CMS Medicare.gov Compare Post Acute Care list provided to:: Patient Choice offered to / list presented to : Patient  Expected Discharge Plan and Services Expected Discharge Plan: [place]   Discharge Planning Services: CM Consult Post Acute Care Choice: IP Rehab Living arrangements for the past 2 months: [place]                                      Prior Living Arrangements/Services Living arrangements for the past 2 months: [place] with:: Self Patient language and need for interpreter reviewed:: Yes Do you feel safe going back to the place where you live?: Yes        Care giver support system in place?: No (comment)   Criminal Activity/Legal Involvement Pertinent to Current Situation/Hospitalization: No - Comment as needed  Activities of Daily Living      Permission Sought/Granted                  Emotional Assessment Appearance:: Appears stated age Attitude/Demeanor/Rapport: Engaged Affect (typically observed): Accepting Orientation: : Oriented to Self, Oriented to Place, Oriented to  Time, Oriented to Situation   Psych Involvement: No (comment)  Admission diagnosis:   Cerebral vascular accident [facility]) [I63.9] Cerebrovascular accident (CVA), unspecified mechanism ([place]) [I63.9] Patient Active Problem List   Diagnosis Date Noted   Cerebral vascular accident ([place]) 03/12/2022   Dental abscess 03/05/2022   Alcohol consumption binge drinking 03/20/2022   Tachycardia 12/05/2021   Tobacco use 11/03/2021   Prediabetes 02/25/2021   Essential hypertension 02/19/2021   Lipoma of neck 02/19/2021   Chronic low back pain 02/19/2021   CVA (cerebral vascular accident) ([place]) 02/19/2021   Alcohol intake above recommended sensible limits 02/19/2021   PCP:  Alcus Dad, MD Pharmacy:   [facility], [place] AT [facility] [place] [address] Phone: [phone] Fax: [phone]     Social Determinants of Health (SDOH) Interventions    Readmission Risk Interventions     No data to display

## 2022-02-27 NOTE — Evaluation (Signed)
SLP Cancellation Note  Patient Details Name: JAVEON MACMURRAY MRN: [id number] DOB: 09/03/1955   Cancelled treatment:       Reason Eval/Treat Not Completed: Other (comment) (Pt passed yale swallow screen, please order SLE or/and BSE if desired. Thank you.)   Macario Golds 02/27/2022, 9:52 AM   Kathleen Lime, MS [facility] SLP Acute Rehab Services Office [phone] Pager [phone]

## 2022-02-27 NOTE — Progress Notes (Signed)
  Echocardiogram 2D Echocardiogram has been performed.  Wynelle Link 02/27/2022, 11:10 AM

## 2022-02-27 NOTE — TOC CAGE-AID Note (Signed)
Transition of Care [facility]) - CAGE-AID Screening   Patient Details  Name: [name] MRN: [id number] Date of Birth: [birth year]  [facility] CM/SW Contact:    Pollie Friar, RN Phone Number: 02/27/2022, 11:48 AM   Clinical Narrative: CM provided pt with inpatient/ outpatient counseling resources for alcohol use.   CAGE-AID Screening:    Have You Ever Felt You Ought to Cut Down on Your Drinking or Drug Use?: Yes Have People Annoyed You By Critizing Your Drinking Or Drug Use?: Yes Have You Felt Bad Or Guilty About Your Drinking Or Drug Use?: No Have You Ever Had a Drink or Used Drugs First Thing In The Morning to Steady Your Nerves or to Get Rid of a Hangover?: No CAGE-AID Score: 2  Substance Abuse Education Offered: Yes  Substance abuse interventions: Patient Counseling, Scientist, clinical (histocompatibility and immunogenetics)

## 2022-02-27 NOTE — Progress Notes (Signed)
? ?  Inpatient Rehab Admissions Coordinator : ? ?Per therapy recommendations, patient was screened for CIR candidacy by Cherokee Boccio RN MSN.  At this time patient appears to be a potential candidate for CIR. I will place a rehab consult per protocol for full assessment. Please call me with any questions. ? ?Ayanah Snader RN MSN ?Admissions Coordinator ?[phone] ?  ?

## 2022-02-27 NOTE — Progress Notes (Signed)
Daily Progress Note Intern Pager: [phone]  Patient name: [name] Medical record number: [id number] Date of birth: 08-17-1955 Age: 66 y.o. Gender: male  Primary Care Provider: Alcus Dad, MD Consultants: Neurology Code Status: Full  Pt Overview and Major Events to Date:  10/5: Admitted to FMTS for CVA work-up  Assessment and Plan: [name] is a 66 year old male who presented with dizziness, and was found to have several posterior circulation acute infarcts in right cerebellar hemisphere, right occipital lobes as well as left thalamus.  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  He was admitted for CVA work-up. Pertinent PMH/PSH includes HTN, tobacco use, alcohol consumption binge drinking, prior CVA (2012).   * Cerebral vascular accident [facility]) Patient remains asymptomatic today.  No new neurologic deficits on exam today.  We added Plavix 90 days, per neurology recommendations.  Pending echo today.  TSH 5.8, this can be followed outpatient.  LDL 15, may carry some risk of hemorrhagic stroke- no hemorrhagic stroke on imaging- we will defer to neurology.  - Nursing vital signs per unit routine, out of bed with assistance only, neurochecks every 4 - Neurology consulted, appreciate recs - Pending PT/OT/SLP eval - Pending Echo - ASA 325, Plavix 75 mg X 90 days. - Continue home atorvastatin 20 mg, appreciate neurology recommendations - Blood pressure within reasonable limits, continue to hold blood pressure medication - Notify primary team if blood pressure > 220/110  Dental abscess Remains asymptomatic today.  Many dental caries on exam.  We will treat and have patient follow-up outpatient dentistry. - Oral penicillin V 500 mg (10/5- 10/9) - Follow fever curve  - Consider orthopanogram if not improving - Outpt dental follow up  Tobacco use Smoking 1 pack/day for 30-40 years. - Nicotine patch 17 mg daily - Encourage smoking cessation -  Provide quit line at discharge  Alcohol consumption binge drinking CIWA scores below 3 overnight. - CIWA protocol, without Ativan (add ativan if CIWA high) - TOC consult for substance abuse counseling  Essential hypertension BP ranges have been 138-167/81-117. Has not taken his home antihypertensive today. -Hold home Hyzaar d/t permissive HTN    FEN/GI: Heart healthy PPx: Lovenox Dispo:Pending PT recommendations , pending clinical improvement .   Subjective:  Patient reports no acute concerns today.  We discussed that he will be seen by PT/OT/SLP and have an echo done today.  He has no questions for [place].  Objective: Temp:  [97.6 F (36.4 C)-99 F (37.2 C)] 98.4 F (36.9 C) (10/06 0741) Pulse Rate:  [79-95] 83 (10/06 0741) Resp:  [13-24] 17 (10/06 0741) BP: (118-167)/(69-117) 118/69 (10/06 0741) SpO2:  [92 %-100 %] 99 % (10/06 0741) Weight:  [93.6 kg] 93.6 kg (10/05 2111) Physical Exam: General: Not in acute distress, pleasant Cardiovascular: Regular rate and rhythm, no MRG Respiratory: CTAB, normal work of breathing on room air Neuro: CN III, IV,VI: EOMI CV V: Normal sensation in V1, V2, V3 CVII: Symmetric smile and brow raise CN VIII: Normal hearing CN IX,X: Symmetric palate raise  CN XI: 5/5 shoulder shrug CN XII: Symmetric tongue protrusion  UE and LE strength 5/5  Normal sensation in UE and LE bilaterally   Laboratory: Most recent CBC Lab Results  Component Value Date   WBC 4.4 02/27/2022   HGB 13.6 02/27/2022   HCT 38.8 (L) 02/27/2022   MCV 93.7 02/27/2022   PLT 194 02/27/2022   Most recent BMP    Latest Ref Rng & Units  02/27/2022    4:45 AM  BMP  Glucose 70 - 99 mg/dL 108   BUN 8 - 23 mg/dL 17   Creatinine 0.61 - 1.24 mg/dL 1.05   Sodium 135 - 145 mmol/L 140   Potassium 3.5 - 5.1 mmol/L 3.4   Chloride 98 - 111 mmol/L 106   CO2 22 - 32 mmol/L 23   Calcium 8.9 - 10.3 mg/dL 10.1     Other pertinent labs  Lipid panel: LDL of 15, triglycerides  173, HDL 33 TSH: 5.80  Leslie Dales, DO 02/27/2022, 8:41 AM  PGY-1, [place] Intern pager: [phone], text pages welcome Secure chat group [place]

## 2022-02-27 NOTE — Evaluation (Signed)
Physical Therapy Evaluation  Patient Details Name: [name] MRN: [id number] DOB: 1955-10-18 Today's Date: 02/27/2022  History of Present Illness  Pt is a 66 y/o male presenting on 10/5 with dizziness. 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 PMH includes: CVA 2012, HTN, alcohol/tobacco use, prediabetes.  Clinical Impression  Pt admitted with above diagnosis. Pt currently with functional limitations due to the deficits listed below (see PT Problem List). At the time of PT eval pt was able to perform transfers to/from EOB with +2 assist and slow, segmental movements to fixate gaze and decrease dizziness. Recommending intensive, multidisciplinary rehab at d/c to maximize functional return, decrease dizziness, and decrease risk for falls. Acutely, pt will benefit from skilled PT to increase their independence and safety with mobility to allow discharge to the venue listed below.          Recommendations for follow up therapy are one component of a multi-disciplinary discharge planning process, led by the attending physician.  Recommendations may be updated based on patient status, additional functional criteria and insurance authorization.  Follow Up Recommendations Acute inpatient rehab (3hours/day)      Assistance Recommended at Discharge Frequent or constant Supervision/Assistance  Patient can return home with the following  A lot of help with walking and/or transfers;A lot of help with bathing/dressing/bathroom;Assistance with cooking/housework;Assist for transportation;Help with stairs or ramp for entrance    Equipment Recommendations Other (comment) (TBD by next venue of care)  Recommendations for Other Services  Rehab consult    Functional Status Assessment Patient has had a recent decline in their functional status and demonstrates the ability to make significant improvements in function in a reasonable and  predictable amount of time.     Precautions / Restrictions Precautions Precautions: Fall Precaution Comments: Vertigo causing N/V Restrictions Weight Bearing Restrictions: No      Mobility  Bed Mobility Overal bed mobility: Needs Assistance Bed Mobility: Rolling, Sidelying to Sit, Sit to Sidelying Rolling: Min guard Sidelying to sit: +2 for safety/equipment, Min assist     Sit to sidelying: Min assist General bed mobility comments: pt transitioned from sidelying to sitting EOB with min assist +2 to trunk support. Limited session due to nausea and vomiting once EOB.  returned to sidelying with min assist.  Pt educated on segmental turning and finding a gaze point before moving body to decrease dizziness.    Transfers                   General transfer comment: Unable to progress to OOB mobility due to vomiting 2 dizziness    Ambulation/Gait                  Stairs            Wheelchair Mobility    Modified Rankin (Stroke Patients Only) Modified Rankin (Stroke Patients Only) Pre-Morbid Rankin Score: No symptoms Modified Rankin: Severe disability     Balance Overall balance assessment: Needs assistance Sitting-balance support: No upper extremity supported, Feet supported Sitting balance-Leahy Scale: Fair Sitting balance - Comments: min guard to min assist statically but limited at EOB                                     Pertinent Vitals/Pain Pain Assessment Pain Assessment: Faces Faces Pain Scale: Hurts little more Pain Location: back- chronic Pain Descriptors / Indicators:  Discomfort Pain Intervention(s): Limited activity within patient's tolerance, Monitored during session, Repositioned    Home Living Family/patient expects to be discharged to:: Other (Comment) ("rooming" house) Living Arrangements: Non-relatives/Friends   Type of Home: House Home Access: Ramped entrance;Stairs to enter Entrance Stairs-Rails: Can reach  both Entrance Stairs-Number of Steps: 3   Home Layout: One level Home Equipment: None Additional Comments: 4 room- 3 roomates    Prior Function Prior Level of Function : Independent/Modified Independent               ADLs Comments: manages ADLs, IADLs (meds); uses community transportation and community healthcare     Hand Dominance   Dominant Hand: Right    Extremity/Trunk Assessment   Upper Extremity Assessment Upper Extremity Assessment: Defer to OT evaluation    Lower Extremity Assessment Lower Extremity Assessment: RLE deficits/detail RLE Deficits / Details: Appears weaker than L in general. Unable to perform full assessment due to vomiting    Cervical / Trunk Assessment Cervical / Trunk Assessment: Normal  Communication   Communication: No difficulties  Cognition Arousal/Alertness: Awake/alert Behavior During Therapy: WFL for tasks assessed/performed Overall Cognitive Status: Within Functional Limits for tasks assessed                                 General Comments: appears WFL, continue assessment        General Comments      Exercises     Assessment/Plan    PT Assessment Patient needs continued PT services  PT Problem List Decreased strength;Decreased range of motion;Decreased activity tolerance;Decreased balance;Decreased mobility;Decreased knowledge of use of DME;Decreased safety awareness;Decreased knowledge of precautions;Decreased coordination;Decreased cognition       PT Treatment Interventions DME instruction;Gait training;Functional mobility training;Therapeutic activities;Therapeutic exercise;Balance training;Patient/family education    PT Goals (Current goals can be found in the Care Plan section)  Acute Rehab PT Goals Patient Stated Goal: Decrease dizziness PT Goal Formulation: With patient Time For Goal Achievement: 03/13/22 Potential to Achieve Goals: Good    Frequency Min 2X/week     Co-evaluation PT/OT/SLP  Co-Evaluation/Treatment: Yes Reason for Co-Treatment: Complexity of the patient's impairments (multi-system involvement);Necessary to address cognition/behavior during functional activity;For patient/therapist safety;To address functional/ADL transfers           AM-PAC PT "6 Clicks" Mobility  Outcome Measure Help needed turning from your back to your side while in a flat bed without using bedrails?: A Little Help needed moving from lying on your back to sitting on the side of a flat bed without using bedrails?: A Little Help needed moving to and from a bed to a chair (including a wheelchair)?: Total Help needed standing up from a chair using your arms (e.g., wheelchair or bedside chair)?: Total Help needed to walk in hospital room?: Total Help needed climbing 3-5 steps with a railing? : Total 6 Click Score: 10    End of Session Equipment Utilized During Treatment: Gait belt Activity Tolerance: Treatment limited secondary to medical complications (Comment) (Vomiting due to dizziness) Patient left: in bed;with call bell/phone within reach;with bed alarm set (MD present) Nurse Communication: Mobility status (Requesting Meclizine) PT Visit Diagnosis: Unsteadiness on feet (R26.81);Dizziness and giddiness (R42);Other symptoms and signs involving the nervous system (N23.557)    Time: 3220-2542 PT Time Calculation (min) (ACUTE ONLY): 25 min   Charges:   PT Evaluation $PT Eval Moderate Complexity: 1 Mod          [name],  PT, DPT Acute Rehabilitation Services Secure Chat Preferred Office: [phone]   Thelma Comp 02/27/2022, 3:22 PM

## 2022-02-27 NOTE — Progress Notes (Signed)
MD on call notified that patient was having 5/10 headache with vomiting new orders were received. Arthor Captain LPN

## 2022-02-27 NOTE — Plan of Care (Signed)

## 2022-02-28 ENCOUNTER — Inpatient Hospital Stay (HOSPITAL_COMMUNITY): Payer: Medicare Other

## 2022-02-28 ENCOUNTER — Inpatient Hospital Stay (HOSPITAL_COMMUNITY): Payer: Medicare Other | Admitting: Certified Registered Nurse Anesthetist

## 2022-02-28 ENCOUNTER — Encounter (HOSPITAL_COMMUNITY): Admission: EM | Disposition: E | Payer: Self-pay | Source: Home / Self Care | Attending: Family Medicine

## 2022-02-28 DIAGNOSIS — I63213 Cerebral infarction due to unspecified occlusion or stenosis of bilateral vertebral arteries: Secondary | ICD-10-CM

## 2022-02-28 DIAGNOSIS — I10 Essential (primary) hypertension: Secondary | ICD-10-CM

## 2022-02-28 DIAGNOSIS — K047 Periapical abscess without sinus: Secondary | ICD-10-CM | POA: Diagnosis not present

## 2022-02-28 DIAGNOSIS — Z720 Tobacco use: Secondary | ICD-10-CM | POA: Diagnosis not present

## 2022-02-28 DIAGNOSIS — I63541 Cerebral infarction due to unspecified occlusion or stenosis of right cerebellar artery: Secondary | ICD-10-CM

## 2022-02-28 DIAGNOSIS — I6302 Cerebral infarction due to thrombosis of basilar artery: Secondary | ICD-10-CM | POA: Diagnosis not present

## 2022-02-28 DIAGNOSIS — I63531 Cerebral infarction due to unspecified occlusion or stenosis of right posterior cerebral artery: Secondary | ICD-10-CM | POA: Diagnosis not present

## 2022-02-28 DIAGNOSIS — F17210 Nicotine dependence, cigarettes, uncomplicated: Secondary | ICD-10-CM

## 2022-02-28 DIAGNOSIS — I639 Cerebral infarction, unspecified: Secondary | ICD-10-CM | POA: Diagnosis not present

## 2022-02-28 DIAGNOSIS — R1312 Dysphagia, oropharyngeal phase: Secondary | ICD-10-CM

## 2022-02-28 DIAGNOSIS — J9600 Acute respiratory failure, unspecified whether with hypoxia or hypercapnia: Secondary | ICD-10-CM

## 2022-02-28 DIAGNOSIS — I63533 Cerebral infarction due to unspecified occlusion or stenosis of bilateral posterior cerebral arteries: Secondary | ICD-10-CM

## 2022-02-28 DIAGNOSIS — I651 Occlusion and stenosis of basilar artery: Secondary | ICD-10-CM | POA: Diagnosis present

## 2022-02-28 DIAGNOSIS — F1010 Alcohol abuse, uncomplicated: Secondary | ICD-10-CM | POA: Diagnosis not present

## 2022-02-28 HISTORY — PX: RADIOLOGY WITH ANESTHESIA: SHX6223

## 2022-02-28 HISTORY — PX: IR CT HEAD LTD: IMG2386

## 2022-02-28 HISTORY — PX: IR PERCUTANEOUS ART THROMBECTOMY/INFUSION INTRACRANIAL INC DIAG ANGIO: IMG6087

## 2022-02-28 HISTORY — PX: IR ANGIO VERTEBRAL SEL VERTEBRAL UNI L MOD SED: IMG5367

## 2022-02-28 LAB — POCT I-STAT 7, (LYTES, BLD GAS, ICA,H+H)
Acid-base deficit: 3 mmol/L — ABNORMAL HIGH (ref 0.0–2.0)
Bicarbonate: 22.5 mmol/L (ref 20.0–28.0)
Calcium, Ion: 1.19 mmol/L (ref 1.15–1.40)
HCT: 35 % — ABNORMAL LOW (ref 39.0–52.0)
Hemoglobin: 11.9 g/dL — ABNORMAL LOW (ref 13.0–17.0)
O2 Saturation: 97 %
Patient temperature: 96.9
Potassium: 3.5 mmol/L (ref 3.5–5.1)
Sodium: 141 mmol/L (ref 135–145)
TCO2: 24 mmol/L (ref 22–32)
pCO2 arterial: 39.2 mmHg (ref 32–48)
pH, Arterial: 7.363 (ref 7.35–7.45)
pO2, Arterial: 95 mmHg (ref 83–108)

## 2022-02-28 LAB — DIFFERENTIAL
Abs Immature Granulocytes: 0.01 10*3/uL (ref 0.00–0.07)
Basophils Absolute: 0 10*3/uL (ref 0.0–0.1)
Basophils Relative: 0 %
Eosinophils Absolute: 0 10*3/uL (ref 0.0–0.5)
Eosinophils Relative: 0 %
Immature Granulocytes: 0 %
Lymphocytes Relative: 36 %
Lymphs Abs: 1.7 10*3/uL (ref 0.7–4.0)
Monocytes Absolute: 0.4 10*3/uL (ref 0.1–1.0)
Monocytes Relative: 8 %
Neutro Abs: 2.5 10*3/uL (ref 1.7–7.7)
Neutrophils Relative %: 56 %

## 2022-02-28 LAB — BLOOD GAS, ARTERIAL
Acid-base deficit: 1 mmol/L (ref 0.0–2.0)
Bicarbonate: 22.2 mmol/L (ref 20.0–28.0)
Drawn by: 137461
Patient temperature: 37
pCO2 arterial: 32 mmHg (ref 32–48)
pH, Arterial: 7.45 (ref 7.35–7.45)
pO2, Arterial: 137 mmHg — ABNORMAL HIGH (ref 83–108)

## 2022-02-28 LAB — CBC
HCT: 40.1 % (ref 39.0–52.0)
Hemoglobin: 12.9 g/dL — ABNORMAL LOW (ref 13.0–17.0)
MCH: 32 pg (ref 26.0–34.0)
MCHC: 32.2 g/dL (ref 30.0–36.0)
MCV: 99.5 fL (ref 80.0–100.0)
Platelets: 170 10*3/uL (ref 150–400)
RBC: 4.03 MIL/uL — ABNORMAL LOW (ref 4.22–5.81)
RDW: 16.2 % — ABNORMAL HIGH (ref 11.5–15.5)
WBC: 4.6 10*3/uL (ref 4.0–10.5)
nRBC: 0 % (ref 0.0–0.2)

## 2022-02-28 LAB — GLUCOSE, CAPILLARY: Glucose-Capillary: 123 mg/dL — ABNORMAL HIGH (ref 70–99)

## 2022-02-28 SURGERY — IR WITH ANESTHESIA
Anesthesia: General

## 2022-02-28 MED ORDER — SODIUM CHLORIDE 0.9 % IV SOLN
250.0000 mL | INTRAVENOUS | Status: DC
  Administered 2022-03-01: 250 mL via INTRAVENOUS

## 2022-02-28 MED ORDER — DOCUSATE SODIUM 50 MG/5ML PO LIQD
100.0000 mg | Freq: Two times a day (BID) | ORAL | Status: DC
  Administered 2022-03-01 (×2): 100 mg
  Filled 2022-02-28 (×2): qty 10

## 2022-02-28 MED ORDER — THIAMINE HCL 100 MG/ML IJ SOLN
100.0000 mg | Freq: Every day | INTRAMUSCULAR | Status: DC
  Administered 2022-03-01: 100 mg via INTRAVENOUS
  Filled 2022-02-28: qty 2

## 2022-02-28 MED ORDER — POLYETHYLENE GLYCOL 3350 17 G PO PACK
17.0000 g | PACK | Freq: Every day | ORAL | Status: DC
  Administered 2022-03-01: 17 g
  Filled 2022-02-28: qty 1

## 2022-02-28 MED ORDER — CLEVIDIPINE BUTYRATE 0.5 MG/ML IV EMUL
0.0000 mg/h | INTRAVENOUS | Status: AC
  Administered 2022-03-01 (×2): 2 mg/h via INTRAVENOUS
  Administered 2022-03-01 (×2): 21 mg/h via INTRAVENOUS
  Filled 2022-02-28 (×5): qty 100

## 2022-02-28 MED ORDER — IOHEXOL 300 MG/ML  SOLN
50.0000 mL | Freq: Once | INTRAMUSCULAR | Status: AC | PRN
  Administered 2022-02-28: 10 mL via INTRA_ARTERIAL

## 2022-02-28 MED ORDER — PENICILLIN G POTASSIUM 20000000 UNITS IJ SOLR
4.0000 10*6.[IU] | Freq: Four times a day (QID) | INTRAVENOUS | Status: DC
  Administered 2022-02-28: 4 10*6.[IU] via INTRAVENOUS
  Filled 2022-02-28 (×6): qty 4

## 2022-02-28 MED ORDER — IOHEXOL 300 MG/ML  SOLN
50.0000 mL | Freq: Once | INTRAMUSCULAR | Status: AC | PRN
  Administered 2022-02-28: 40 mL via INTRA_ARTERIAL

## 2022-02-28 MED ORDER — NITROGLYCERIN 1 MG/10 ML FOR IR/CATH LAB
INTRA_ARTERIAL | Status: AC
  Filled 2022-02-28: qty 10

## 2022-02-28 MED ORDER — LACTATED RINGERS IV SOLN: INTRAVENOUS | Status: AC

## 2022-02-28 MED ORDER — PROPOFOL 1000 MG/100ML IV EMUL
0.0000 ug/kg/min | INTRAVENOUS | Status: DC
  Administered 2022-03-01: 20 ug/kg/min via INTRAVENOUS
  Filled 2022-02-28 (×2): qty 100

## 2022-02-28 MED ORDER — VERAPAMIL HCL 2.5 MG/ML IV SOLN
INTRAVENOUS | Status: AC
  Filled 2022-02-28: qty 2

## 2022-02-28 MED ORDER — SODIUM CHLORIDE 0.9 % IV SOLN
3.0000 g | Freq: Four times a day (QID) | INTRAVENOUS | Status: DC
  Administered 2022-02-28 – 2022-03-01 (×5): 3 g via INTRAVENOUS
  Filled 2022-02-28 (×6): qty 8

## 2022-02-28 MED ORDER — NOREPINEPHRINE 4 MG/250ML-% IV SOLN: 2.0000 ug/min | INTRAVENOUS | Status: DC

## 2022-02-28 MED ORDER — PHENYLEPHRINE HCL-NACL 20-0.9 MG/250ML-% IV SOLN: 25.0000 ug/min | INTRAVENOUS | Status: DC

## 2022-02-28 MED ORDER — ROCURONIUM BROMIDE 100 MG/10ML IV SOLN
INTRAVENOUS | Status: DC | PRN
  Administered 2022-02-28: 100 mg via INTRAVENOUS
  Administered 2022-02-28: 50 mg via INTRAVENOUS

## 2022-02-28 MED ORDER — FENTANYL CITRATE (PF) 100 MCG/2ML IJ SOLN
INTRAMUSCULAR | Status: DC | PRN
  Administered 2022-02-28: 100 ug via INTRAVENOUS
  Administered 2022-02-28: 150 ug via INTRAVENOUS

## 2022-02-28 MED ORDER — ASPIRIN 325 MG PO TABS: 325.0000 mg | ORAL_TABLET | Freq: Every day | ORAL | Status: DC

## 2022-02-28 MED ORDER — SODIUM CHLORIDE 0.9 % IV SOLN: INTRAVENOUS | Status: DC

## 2022-02-28 MED ORDER — TICAGRELOR 90 MG PO TABS
ORAL_TABLET | ORAL | Status: AC
  Filled 2022-02-28: qty 2

## 2022-02-28 MED ORDER — IOHEXOL 300 MG/ML  SOLN
150.0000 mL | Freq: Once | INTRAMUSCULAR | Status: AC | PRN
  Administered 2022-02-28: 120 mL via INTRA_ARTERIAL

## 2022-02-28 MED ORDER — ONDANSETRON HCL 4 MG/2ML IJ SOLN
4.0000 mg | Freq: Four times a day (QID) | INTRAMUSCULAR | Status: DC | PRN
  Administered 2022-02-28 – 2022-03-01 (×3): 4 mg via INTRAVENOUS
  Filled 2022-02-28 (×3): qty 2

## 2022-02-28 MED ORDER — MIDAZOLAM HCL 2 MG/2ML IJ SOLN
INTRAMUSCULAR | Status: AC
  Filled 2022-02-28: qty 2

## 2022-02-28 MED ORDER — SUCCINYLCHOLINE CHLORIDE 200 MG/10ML IV SOSY
PREFILLED_SYRINGE | INTRAVENOUS | Status: DC | PRN
  Administered 2022-02-28: 120 mg via INTRAVENOUS

## 2022-02-28 MED ORDER — FENTANYL CITRATE (PF) 250 MCG/5ML IJ SOLN
INTRAMUSCULAR | Status: AC
  Filled 2022-02-28: qty 5

## 2022-02-28 MED ORDER — HEPARIN SODIUM (PORCINE) 1000 UNIT/ML IJ SOLN
INTRAMUSCULAR | Status: AC
  Filled 2022-02-28: qty 10

## 2022-02-28 MED ORDER — CEFAZOLIN SODIUM-DEXTROSE 2-3 GM-%(50ML) IV SOLR
INTRAVENOUS | Status: DC | PRN
  Administered 2022-02-28: 2 g via INTRAVENOUS

## 2022-02-28 MED ORDER — PROPOFOL 500 MG/50ML IV EMUL
INTRAVENOUS | Status: DC | PRN
  Administered 2022-02-28: 75 ug/kg/min via INTRAVENOUS

## 2022-02-28 MED ORDER — ACETAMINOPHEN 325 MG PO TABS: 650.0000 mg | ORAL_TABLET | ORAL | Status: DC | PRN

## 2022-02-28 MED ORDER — IPRATROPIUM-ALBUTEROL 0.5-2.5 (3) MG/3ML IN SOLN: 3.0000 mL | RESPIRATORY_TRACT | Status: DC | PRN

## 2022-02-28 MED ORDER — ASPIRIN 81 MG PO CHEW
324.0000 mg | CHEWABLE_TABLET | Freq: Every day | ORAL | Status: DC
  Administered 2022-03-01: 324 mg
  Filled 2022-02-28: qty 4

## 2022-02-28 MED ORDER — ACETAMINOPHEN 650 MG RE SUPP: 650.0000 mg | RECTAL | Status: DC | PRN

## 2022-02-28 MED ORDER — LIDOCAINE HCL (CARDIAC) PF 100 MG/5ML IV SOSY
PREFILLED_SYRINGE | INTRAVENOUS | Status: DC | PRN
  Administered 2022-02-28: 60 mg via INTRAVENOUS

## 2022-02-28 MED ORDER — CLOPIDOGREL BISULFATE 75 MG PO TABS: 75.0000 mg | ORAL_TABLET | Freq: Every day | ORAL | Status: DC

## 2022-02-28 MED ORDER — CLOPIDOGREL BISULFATE 75 MG PO TABS
75.0000 mg | ORAL_TABLET | Freq: Every day | ORAL | Status: DC
  Administered 2022-03-01: 75 mg
  Filled 2022-02-28: qty 1

## 2022-02-28 MED ORDER — ESMOLOL HCL 100 MG/10ML IV SOLN
INTRAVENOUS | Status: DC | PRN
  Administered 2022-02-28 (×2): 20 mg via INTRAVENOUS

## 2022-02-28 MED ORDER — FENTANYL 2500MCG IN NS 250ML (10MCG/ML) PREMIX INFUSION
25.0000 ug/h | INTRAVENOUS | Status: DC
  Administered 2022-02-28 – 2022-03-01 (×2): 25 ug/h via INTRAVENOUS
  Filled 2022-02-28 (×2): qty 250

## 2022-02-28 MED ORDER — HEPARIN SODIUM (PORCINE) 1000 UNIT/ML IJ SOLN
INTRAMUSCULAR | Status: DC | PRN
  Administered 2022-02-28: 1000 [IU] via INTRAVENOUS
  Administered 2022-02-28: 2000 [IU] via INTRAVENOUS

## 2022-02-28 MED ORDER — PANTOPRAZOLE 2 MG/ML SUSPENSION: 40.0000 mg | Freq: Every day | ORAL | Status: DC

## 2022-02-28 MED ORDER — PROPOFOL 10 MG/ML IV BOLUS
INTRAVENOUS | Status: DC | PRN
  Administered 2022-02-28: 150 mg via INTRAVENOUS
  Administered 2022-02-28: 50 mg via INTRAVENOUS

## 2022-02-28 MED ORDER — SODIUM CHLORIDE 0.9 % IV SOLN: INTRAVENOUS | Status: DC | PRN

## 2022-02-28 MED ORDER — CLOPIDOGREL BISULFATE 75 MG PO TABS
ORAL_TABLET | ORAL | Status: DC | PRN
  Administered 2022-02-28: 150 mg via NASOGASTRIC

## 2022-02-28 MED ORDER — FENTANYL CITRATE PF 50 MCG/ML IJ SOSY
25.0000 ug | PREFILLED_SYRINGE | Freq: Once | INTRAMUSCULAR | Status: AC
  Administered 2022-02-28: 25 ug via INTRAVENOUS
  Filled 2022-02-28: qty 1

## 2022-02-28 MED ORDER — ADULT MULTIVITAMIN LIQUID CH
15.0000 mL | Freq: Every day | ORAL | Status: DC
  Administered 2022-03-01: 15 mL
  Filled 2022-02-28 (×2): qty 15

## 2022-02-28 MED ORDER — IOHEXOL 350 MG/ML SOLN
100.0000 mL | Freq: Once | INTRAVENOUS | Status: AC | PRN
  Administered 2022-02-28: 100 mL via INTRAVENOUS

## 2022-02-28 MED ORDER — PHENYLEPHRINE HCL-NACL 20-0.9 MG/250ML-% IV SOLN
INTRAVENOUS | Status: DC | PRN
  Administered 2022-02-28: 40 ug/min via INTRAVENOUS

## 2022-02-28 MED ORDER — FOLIC ACID 1 MG PO TABS
1.0000 mg | ORAL_TABLET | Freq: Every day | ORAL | Status: DC
  Administered 2022-03-01: 1 mg
  Filled 2022-02-28: qty 1

## 2022-02-28 MED ORDER — CLOPIDOGREL BISULFATE 75 MG PO TABS
ORAL_TABLET | ORAL | Status: AC
  Filled 2022-02-28: qty 2

## 2022-02-28 MED ORDER — FENTANYL BOLUS VIA INFUSION
25.0000 ug | INTRAVENOUS | Status: DC | PRN
  Administered 2022-02-28 – 2022-03-01 (×3): 25 ug via INTRAVENOUS

## 2022-02-28 MED ORDER — PHENYLEPHRINE HCL (PRESSORS) 10 MG/ML IV SOLN
INTRAVENOUS | Status: DC | PRN
  Administered 2022-02-28: 160 ug via INTRAVENOUS
  Administered 2022-02-28: 80 ug via INTRAVENOUS
  Administered 2022-02-28: 160 ug via INTRAVENOUS
  Administered 2022-02-28: 80 ug via INTRAVENOUS

## 2022-02-28 MED ORDER — ACETAMINOPHEN 160 MG/5ML PO SOLN
650.0000 mg | ORAL | Status: DC | PRN
  Administered 2022-03-01: 650 mg
  Filled 2022-02-28: qty 20.3

## 2022-02-28 MED ORDER — ONDANSETRON HCL 4 MG/2ML IJ SOLN: 4.0000 mg | Freq: Once | INTRAMUSCULAR | Status: AC

## 2022-02-28 MED ORDER — SODIUM CHLORIDE (PF) 0.9 % IJ SOLN
INTRAVENOUS | Status: AC | PRN
  Administered 2022-02-28 (×2): 25 ug via INTRA_ARTERIAL

## 2022-02-28 MED ORDER — PANTOPRAZOLE 2 MG/ML SUSPENSION
40.0000 mg | Freq: Every day | ORAL | Status: DC
  Administered 2022-03-01: 40 mg
  Filled 2022-02-28: qty 20

## 2022-02-28 NOTE — Progress Notes (Signed)
Daily Progress Note Intern Pager: [phone]  Patient name: [name] Medical record number: [id number] Date of birth: [birth year] Age: 66 y.o. 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 He is stable and awaiting placement to CIR. Pertinent PMH/PSH includes HTN, tobacco use, alcohol consumption binge drinking, prior CVA (2012).   * Cerebral vascular accident [facility]) Still with intermittent dizziness and nausea/vomiting requiring anti-emetic overnight, likely sequela of CVA. Additionally, was made NPO due to difficulty swallowing and coughing- will need SLP eval. Neuro examination stable from 10/6. On DAPT with ASA 325 mg and Plavix 75 mg daily x3 months and then Plavix monotherapy per Neurology recommendations. Does not need high intensity statin given his low LDL and statin dose has been reduced. He has been recommended for CIR and is awaiting placement. Echo showed EF 45-50%, global hypokinesis, G1DD.  - Neurology has signed off. He should follow up outpatient in 4 weeks.  - NPO until SLP evaluation - DAPT: ASA 325, Plavix 75 mg X 90 days followed by Plavix monotherapy - Atorvastatin reduced to 10 mg given low LDL - Can start to normalize BP  Dental abscess Asymptomatic, incidental finding on imaging. On abx, and should follow up with dentist outpatient. - Oral penicillin V 500 mg (10/5- 10/9)  - Consider orthopanogram if worsens clinically - Outpt dental follow up  Tobacco use Smoking 1 pack/day for 30-40 years. - Nicotine patch 17 mg daily - Encourage smoking cessation -  Provide quit line at discharge  Alcohol consumption binge drinking CIWA scores below 3 overnight. - CIWA protocol, without Ativan - Can d/c CIWA if remains low today - TOC consult for substance abuse counseling  Essential hypertension BP near normotensive off of his Hyzaar. -Continue to hold home Hyzaar -If BP is persistently elevated can add back home medication. Out of permissive HTN window   FEN/GI: NPO until SLP eval PPx: Lovenox Dispo:CIR    . Barriers include: bed availability.   Subjective:  Discussed with LPN outside of room who notes that patient had more garbled speech overnight, concern for difficulty swallowing so was made NPO until SLP eval. LPN also shares that patient has a hard time getting up- is generally weak even despite 2-person assist so condom cath was placed.  Patient shares some frustration with not being able to do the things he could prior to hospitalization such as walk and talk. He notes changes to his speech. He denies any headache, vision changes, or other complaints. He is amenable to inpatient rehabilitation.   Objective: Temp:  [97.7 F (36.5 C)-99.3 F (37.4 C)] 98.2 F (36.8 C) (10/07 0411) Pulse Rate:  [60-95] 60 (10/07 0411) Resp:  [16-18] 18 (10/07 0411) BP: (118-150)/(69-113) 133/90 (10/07 0411) SpO2:  [98 %-100 %] 100 % (10/07 0411) Physical Exam: General: NAD, pleasant, cooperative with exam  Cardiovascular: Difficult to appreciate over rhonchi, 2+ radial pulses b/l Respiratory: Diffuse rhonchi bilaterally  Abdomen: Soft, non-tender to palpation of all quadrants  Extremities: No edema Neuro: AxO x3. Mild dysarthria present Cranial Nerves: II: Visual Fields are full.  III,IV, VI: EOMI without ptosis or  diplopia.  VII: Slight left sided facial droop  VIII: hearing is intact to voice X: Palate elevates symmetrically XI: Shoulder shrug is symmetric. XII: tongue is midline without atrophy or fasciculations.  Motor: Tone is normal.  Bulk is normal. 5/5 strength was present in all four extremities.   Laboratory: Most recent CBC Lab Results  Component Value Date   WBC 4.4 02/27/2022   HGB 13.6 02/27/2022   HCT 38.8 (L) 02/27/2022   MCV 93.7 02/27/2022   PLT 194 02/27/2022   Most recent BMP    Latest Ref Rng & Units 02/27/2022    4:45 AM  BMP  Glucose 70 - 99 mg/dL 108   BUN 8 - 23 mg/dL 17   Creatinine 0.61 - 1.24 mg/dL 1.05   Sodium 135 - 145 mmol/L 140   Potassium 3.5 - 5.1 mmol/L 3.4   Chloride 98 - 111 mmol/L 106   CO2 22 - 32 mmol/L 23   Calcium 8.9 - 10.3 mg/dL 10.1      Imaging/Diagnostic Tests: None new  Sharion Settler, DO 03/15/2022, 6:10 AM  PGY-3, [place] Intern pager: [phone], text pages welcome Secure chat group [place]

## 2022-02-28 NOTE — Progress Notes (Signed)
Inpatient Rehab Admissions:  Inpatient Rehab Consult received.  I met with patient at the bedside for rehabilitation assessment and to discuss goals and expectations of an inpatient rehab admission.  Discussed average length of stay, discharge home after completion of CIR, insurance authorization requirement of his insurance. Pt acknowledged understanding. Pt interested in pursuing CIR. However, pt does not think any family or friends will be able to provide support after discharge. Pt gave permission to contact sister Letta Median. Called [place] and no one answered; unable to leave a message.  Also note, pt does not appear to be able to tolerate the intensity of CIR at this time d/t dizziness and nausea. Will continue to follow.  Signed: Gayland Curry, [place], [place] Admissions Coordinator [phone]

## 2022-02-28 NOTE — Evaluation (Signed)
Clinical/Bedside Swallow Evaluation Patient Details  Name: [name] MRN: [id number] Date of Birth: December 20, 1955  Today's Date: 03/17/2022 Time: SLP Start Time (ACUTE ONLY): 0806 SLP Stop Time (ACUTE ONLY): 0830 SLP Time Calculation (min) (ACUTE ONLY): 24 min  Past Medical History:  Past Medical History:  Diagnosis Date   Stroke [facility])    Past Surgical History: History reviewed. No pertinent surgical history. HPI:  Pt is a 66 y/o male presenting on 10/5 with dizziness. 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 Chronic infarcts in the left occipital lobe, left  frontal lobe, left parietal lobe, and the right cerebellum. Pt noted by RN to cough when drinking water; swallow evaluation ordered.   PMH includes: CVA 2012, HTN, alcohol/tobacco use, prediabetes.    Assessment / Plan / Recommendation  Clinical Impression  [name] participated in clinical swallowing assessment. HOB was elevated slowly given his N/V. He washed his face and brushed his teeth with RUE and intermittent physical assist due to ataxia. CN exam revealed mild L central VII asymmetry; tongue midline.  Pt reports adequate sensation bilaterally face/mouth.  Voice was wet at baseline.  Consumption of ice chips, thin liquids, and nectars led to explosive coughing and sneezing, concerning for aspiration.  Pureed food and honey thick liquids were swallowed with no overt s/s of aspiration.   D/W [name] the impact of his strokes on swallowing; we agreed to start with a pureed/dysphagia 1 diet and honey-thick liquids.  We discussed the temporary nature of this conservative diet and that our plan next week will be to proceed with an MBS to further evaluate physiology. He verbalized understanding.SLP will follow. SLP Visit Diagnosis: Dysphagia, oropharyngeal phase (R13.12)    Aspiration Risk  Mild aspiration risk    Diet Recommendation   Dysphagia 1, honey thick  liquids  Medication Administration: Crushed with puree    Other  Recommendations Oral Care Recommendations: Oral care BID    Recommendations for follow up therapy are one component of a multi-disciplinary discharge planning process, led by the attending physician.  Recommendations may be updated based on patient status, additional functional criteria and insurance authorization.  Follow up Recommendations Acute inpatient rehab (3hours/day)          Functional Status Assessment Patient has had a recent decline in their functional status and demonstrates the ability to make significant improvements in function in a reasonable and predictable amount of time.  Frequency and Duration min 2x/week  2 weeks       Prognosis Prognosis for Safe Diet Advancement: Good      Swallow Study   General Date of Onset: 02/27/22 HPI: Pt is a 66 y/o male presenting on 10/5 with dizziness. 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 Chronic infarcts in the left occipital lobe, left  frontal lobe, left parietal lobe, and the right cerebellum. Pt noted by RN to cough when drinking water; swallow evaluation ordered.   PMH includes: CVA 2012, HTN, alcohol/tobacco use, prediabetes. Type of Study: Bedside Swallow Evaluation Diet Prior to this Study: NPO Temperature Spikes Noted: No Respiratory Status: Room air History of Recent Intubation: No Behavior/Cognition: Alert;Cooperative;Pleasant mood Oral Cavity Assessment: Within Functional Limits Oral Care Completed by SLP: Yes Oral Cavity - Dentition: Adequate natural dentition Vision: Impaired for self-feeding Self-Feeding Abilities: Needs assist Patient Positioning: Upright in bed Baseline Vocal Quality: Wet Volitional Cough: Strong Volitional Swallow: Able to elicit  Oral/Motor/Sensory Function Overall Oral Motor/Sensory Function: Mild impairment Facial Symmetry: Abnormal symmetry  left;Suspected CN VII (facial) dysfunction Lingual ROM: Within Functional Limits Lingual Symmetry: Within Functional Limits   Ice Chips Ice chips: Impaired Presentation: Spoon Pharyngeal Phase Impairments: Wet Vocal Quality;Multiple swallows;Cough - Immediate   Thin Liquid Thin Liquid: Impaired Presentation: Spoon Pharyngeal  Phase Impairments: Wet Vocal Quality;Multiple swallows;Cough - Immediate    Nectar Thick Nectar Thick Liquid: Impaired Presentation: Cup Pharyngeal Phase Impairments: Cough - Immediate   Honey Thick Honey Thick Liquid: Within functional limits   Puree Puree: Impaired Presentation: Spoon Oral Phase Functional Implications: Oral residue   Solid     Solid: Not tested      Juan Quam Laurice 03/24/2022,9:05 AM Estill Bamberg L. Tivis Ringer, MA CCC/SLP Clinical Specialist - [place] Office number [phone]

## 2022-02-28 NOTE — Progress Notes (Signed)
Late entry: spoke with patients mother Ossi about progression of patient today and new neurological findings and issues speaking. Discussed imaging and that neuro following closely. Mother had no additional questions, grateful for update.

## 2022-02-28 NOTE — Anesthesia Procedure Notes (Signed)
Procedure Name: Intubation Date/Time: 03/21/2022 7:52 PM  Performed by: Deunte Bledsoe T, CRNAPre-anesthesia Checklist: Patient identified, Emergency Drugs available, Suction available and Patient being monitored Patient Re-evaluated:Patient Re-evaluated prior to induction Oxygen Delivery Method: Circle system utilized Preoxygenation: Pre-oxygenation with 100% oxygen Induction Type: IV induction, Rapid sequence and Cricoid Pressure applied Ventilation: Mask ventilation without difficulty Laryngoscope Size: Glidescope and 4 Grade View: Grade II Tube type: Oral Tube size: 7.5 mm Number of attempts: 1 Airway Equipment and Method: Stylet, Oral airway and Video-laryngoscopy Placement Confirmation: ETT inserted through vocal cords under direct vision, positive ETCO2 and breath sounds checked- equal and bilateral Secured at: 22 cm Tube secured with: Tape Dental Injury: Teeth and Oropharynx as per pre-operative assessment

## 2022-02-28 NOTE — Anesthesia Preprocedure Evaluation (Addendum)
Anesthesia Evaluation  Patient identified by MRN, date of birth, ID bandPreop documentation limited or incomplete due to emergent nature of procedure.  Airway Mallampati: Unable to assess       Dental  (+) Poor Dentition   Pulmonary Current Smoker and Patient abstained from smoking.   Pulmonary exam normal        Cardiovascular hypertension, Pt. on medications Normal cardiovascular exam     Neuro/Psych CVA    GI/Hepatic ,,,(+)     substance abuse    Endo/Other    Renal/GU      Musculoskeletal   Abdominal   Peds  Hematology   Anesthesia Other Findings CODE STROKE  Reproductive/Obstetrics                             Anesthesia Physical Anesthesia Plan  ASA: 4 and emergent  Anesthesia Plan: General   Post-op Pain Management:    Induction: Intravenous  PONV Risk Score and Plan: 1 and Ondansetron, Dexamethasone and Treatment may vary due to age or medical condition  Airway Management Planned: Oral ETT  Additional Equipment:   Intra-op Plan:   Post-operative Plan: Possible Post-op intubation/ventilation  Informed Consent:      Only emergency history available  Plan Discussed with: CRNA  Anesthesia Plan Comments:        Anesthesia Quick Evaluation

## 2022-02-28 NOTE — Progress Notes (Addendum)
FMTS Interim Progress Note  S: Went to assess patient after I saw notes that showed he was not able to tolerate MRI due to sedation and emesis.  Patient was responsive to voice however quickly closes eyes again.  Was dysarthric and had left facial droop.  Unable to answer all orientation questions loudly.  RN let me know that he was alert and responsive this morning and had slight left-sided deficits but not to the extent of after his attempt to go down to MRI.  He was much more sedated and had flaccid left extremities on exam.  O: BP (!) 154/86 (BP Location: Left Arm)   Pulse 64   Temp 98.3 F (36.8 C)   Resp 16   Ht 5' 7"  (1.702 m)   Wt 93.6 kg   SpO2 99%   BMI 32.32 kg/m    Gen: sedated appearing Resp: rhodochrous upper airway noises throughout lung fields bilaterally Neuro: Oriented to person quietly whispered this, oriented to age but unable to understand other orientation questions Neuro: CN II: 1 mm pupils, reactive but overall constricted CN III, IV,VI: Left lateral gaze preference, unable to look to his right, able to look up and down CV V: Unable to assess CVII: Asymmetric facial features with left-sided facial droop present CN VIII: Normal hearing CN IX,X: Palate unable to assess CN XI: Unable to assess CN XII: Unable to assess LUE with 0 out of 5 strength, no mm contraction, unable to squeeze fingers and LLE 2/5  A/P: AMS  New Neurologic Deficits  Aspiration  -Contacted Dr. Erlinda Hong with neurology and he met me at bedside, patient was unable to tolerate his MRI-we will attempt to obtain stat CT angio with perfusion and CT head without contrast studies.  Suspect pontine stroke given new onset aspiration and left-sided weakness and leftward gaze preference -Has had possible aspiration episodes this morning, will obtain stat chest x-ray -Continuous cardiac monitoring reordered -Continuous pulse ox -ABG ordered -Monitor fever curve -Revisit VTE prophylaxis after CT  head -NPO -Appreciate neurology recs Ddx: Alcohol-history of binge drinking CIWA scores have been low at 2 Endocrine-check stat CBG Electrolytes-overall within normal limits this morning Insulin-not on any O2-pulse ox has been normal, will order continuous pulse ox and obtain ABG given possible aspiration event in MRI, CXR Opiates-not on any or any sedating medicines Uremia-unlikely patient's kidney function has been within normal limits and BUN this morning was normal Trauma-no acute known trauma, will assess on CT head Infection-possible aspiration event-no fevers, CXR, monitor fever curves Seizure-unlikely as patient does not have known history Stroke-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

## 2022-02-28 NOTE — Progress Notes (Signed)
Pharmacy Antibiotic Note  [name] is a 66 y.o. male admitted on 03/01/2022 with possible aspiration pneumonia. Patient was on penicillin for asymptomatic dental abscess.  Pharmacy has been consulted for ampicillin/sulbactam dosing.  Plan: Ampicillin/sulbactam 3g q6 hr Monitor cultures, clinical status, renal function Narrow abx as able and f/u duration   Height: '5\' 7"'$  (170.2 cm) Weight: 93.6 kg (206 lb 5.6 oz) IBW/kg (Calculated) : 66.1  Temp (24hrs), Avg:98.3 F (36.8 C), Min:97.7 F (36.5 C), Max:99.3 F (37.4 C)  Recent Labs  Lab 03/10/2022 1145 03/13/2022 1254 02/27/22 0445  WBC 5.0  --  4.4  CREATININE 1.06 1.10 1.05    Estimated Creatinine Clearance: 75.5 mL/min (by C-G formula based on SCr of 1.05 mg/dL).    No Known Allergies  Antimicrobials this admission: Cefazolin 10/7   PCN 10/5 >> 10/7    Microbiology results: None   Thank you for allowing pharmacy to be a part of this patient's care.  Benetta Spar, PharmD, BCPS, BCCP Clinical Pharmacist  Please check AMION for all [place] phone numbers After 10:00 PM, call [place] [phone]

## 2022-02-28 NOTE — Evaluation (Signed)
Speech Language Pathology Evaluation Patient Details Name: [name] MRN: [id number] DOB: 11-26-1955 Today's Date: 02/23/2022 Time: 4174-0814 SLP Time Calculation (min) (ACUTE ONLY): 27 min  Problem List:  Patient Active Problem List   Diagnosis Date Noted   Cerebral vascular accident ([place]) 03/15/2022   Dental abscess 03/01/2022   Alcohol consumption binge drinking 03/04/2022   Tachycardia 12/05/2021   Tobacco use 11/03/2021   Prediabetes 02/25/2021   Essential hypertension 02/19/2021   Lipoma of neck 02/19/2021   Chronic low back pain 02/19/2021   CVA (cerebral vascular accident) ([place]) 02/19/2021   Alcohol intake above recommended sensible limits 02/19/2021   Past Medical History:  Past Medical History:  Diagnosis Date   Stroke [facility])    Past Surgical History: History reviewed. No pertinent surgical history. HPI:  Pt is a 66 y/o male presenting on 10/5 with dizziness. 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 Chronic infarcts in the left occipital lobe, left  frontal lobe, left parietal lobe, and the right cerebellum. Pt noted by RN to cough when drinking water; swallow evaluation ordered.   PMH includes: CVA 2012, HTN, alcohol/tobacco use, prediabetes.   Assessment / Plan / Recommendation Clinical Impression  Mr. Vanblarcom presents with a moderate dysarthria of speech (he reports it is worse than day prior). Cognition, expressive, and receptive language are WNL. He responds well to cues to slow down and over-articulate. He recognizes that others are not understanding him and is appropriately frustrated. We discussed the benefit of speech therapy - he is an excellent candidate for AIR, has good awareness and is motivated to recover.    SLP Assessment  SLP Recommendation/Assessment: Patient needs continued Speech [place] [facility] SLP Visit Diagnosis: Dysarthria and anarthria (R47.1)    Recommendations  for follow up therapy are one component of a multi-disciplinary discharge planning process, led by the attending physician.  Recommendations may be updated based on patient status, additional functional criteria and insurance authorization.    Follow Up Recommendations  Acute inpatient rehab (3hours/day)    Assistance Recommended at Discharge  Intermittent Supervision/Assistance  Functional Status Assessment Patient has had a recent decline in their functional status and demonstrates the ability to make significant improvements in function in a reasonable and predictable amount of time.  Frequency and Duration min 2x/week  2 weeks      SLP Evaluation Cognition  Overall Cognitive Status: Within Functional Limits for tasks assessed Arousal/Alertness: Awake/alert Orientation Level: Oriented X4 Attention: Alternating Alternating Attention: Appears intact Memory: Appears intact Awareness: Appears intact       Comprehension  Auditory Comprehension Overall Auditory Comprehension: Appears within functional limits for tasks assessed Yes/No Questions: Within Functional Limits Commands: Within Functional Limits Reading Comprehension Reading Status: Impaired    Expression Expression Primary Mode of Expression: Verbal Verbal Expression Overall Verbal Expression: Appears within functional limits for tasks assessed Level of Generative/Spontaneous Verbalization: Financial controller Expression Dominant Hand: Right Written Expression: Not tested   Oral / Motor  Oral Motor/Sensory Function Overall Oral Motor/Sensory Function: Mild impairment Facial Symmetry: Abnormal symmetry left;Suspected CN VII (facial) dysfunction Lingual ROM: Within Functional Limits Lingual Symmetry: Within Functional Limits Motor Speech Overall Motor Speech: Impaired Respiration: Within functional limits Phonation: Wet Resonance: Within functional limits Articulation: Impaired Level of Impairment:  Word Intelligibility: Intelligibility reduced Word: 50-74% accurate Phrase: 50-74% accurate Sentence: 50-74% accurate Conversation: 50-74% accurate Motor Planning: Witnin functional limits Effective Techniques: Over-articulate;Pause;Slow rate  Juan Quam Laurice 03/22/2022, 9:21 AM  Park Pope. Tivis Ringer, MA CCC/SLP Clinical Specialist - [place] Office number [phone]

## 2022-02-28 NOTE — Progress Notes (Addendum)
Notified MD  Dr Kathrynn Speed on cal via chatl that patient coughs whenever he takes a sip of water and that earlier he has some difficulty swallowing his medication Patient is now NPO until speech evaluation.

## 2022-02-28 NOTE — Transfer of Care (Signed)
Immediate Anesthesia Transfer of Care Note  Patient: [name]  Procedure(s) Performed: IR WITH ANESTHESIA  Patient Location: ICU  Anesthesia Type:General  Level of Consciousness: Patient remains intubated per anesthesia plan  Airway & Oxygen Therapy: Patient remains intubated per anesthesia plan and Patient placed on Ventilator (see vital sign flow sheet for setting)  Post-op Assessment: Report given to RN and Post -op Vital signs reviewed and stable  Post vital signs: Reviewed and stable  Last Vitals:  Vitals Value Taken Time  BP 107/85 03/15/2022 2245  Temp    Pulse 86 03/09/2022 2252  Resp 16 02/24/2022 2252  SpO2 96 % 03/09/2022 2252  Vitals shown include unvalidated device data.  Last Pain:  Vitals:   03/24/2022 1113  TempSrc: Oral  PainSc: 0-No pain         Complications: No notable events documented.

## 2022-02-28 NOTE — Progress Notes (Signed)
Patient arrived for his MRI and seems very lethargic. After changing him into a gown he started to cough/vomit, he seemed like he was having difficulty coughing. It is not safe to lay him flat currently, called the RN to let her know how he was acting that she may have to suction the patient. She said that he hasn't received anything by mouth so she doesn't know why he would be doing that. She said that she has suction set up in his room. Patient currently being transported back to his room.

## 2022-02-28 NOTE — Progress Notes (Addendum)
MD Dr. Shellia Cleverly on call notified that patient was projectile vomiting patient is alert and orient has garbled speech vital sign charted in epic. New orders received. Arthor Captain LPN

## 2022-02-28 NOTE — Progress Notes (Signed)
Occupational Therapy Treatment Patient Details Name: [name] MRN: [id number] DOB: 03/19/1956 Today's Date: 03/23/2022   History of present illness Pt is a 66 y/o male presenting on 10/5 with dizziness. 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 PMH includes: CVA 2012, HTN, alcohol/tobacco use, prediabetes.   OT comments  Patient met lying supine in bed. PT present initially for treatment session. OT treatment session limited to bed level this date secondary to episode of emesis with movement earlier. Focus of session on oral hygiene with suction toothbrush with patient requiring Max/Total A. Limited by fatigue/lethargy after recent med administration. Attempted to reposition patient in supine. Patient able to flex L knee. Mod/Max A to flex R knee but able to maintain position after placement. Unable to bridge in supine this date. +2 assist recommended for all bed mobility. Will continue efforts in prep for safe d/c to next level of care with continued recommendation for AIR.   Recommendations for follow up therapy are one component of a multi-disciplinary discharge planning process, led by the attending physician.  Recommendations may be updated based on patient status, additional functional criteria and insurance authorization.    Follow Up Recommendations  Acute inpatient rehab (3hours/day)    Assistance Recommended at Discharge Frequent or constant Supervision/Assistance  Patient can return home with the following  A lot of help with walking and/or transfers;A lot of help with bathing/dressing/bathroom;Assistance with cooking/housework;Direct supervision/assist for medications management;Direct supervision/assist for financial management;Assist for transportation;Help with stairs or ramp for entrance   Equipment Recommendations  Other (comment) (Defer to next level of care)    Recommendations for Other Services Rehab  consult    Precautions / Restrictions Precautions Precautions: Fall;Other (comment) Precaution Comments: Vertigo causing N/V       Mobility Bed Mobility Overal bed mobility: Needs Assistance             General bed mobility comments: Bed placed in chair position. Pt able to pull forward into sit using bilat lower bedrails with min guard assist.    Transfers                   General transfer comment: Unable to progress to OOB mobility due to dizziness. Projectile vomiting earlier today with rolling attempts.     Balance Overall balance assessment: Needs assistance                                         ADL either performed or assessed with clinical judgement   ADL Overall ADL's : Needs assistance/impaired     Grooming: Total assistance Grooming Details (indicate cue type and reason): Total A for oral hygiene with suction toothbrush.                                    Extremity/Trunk Assessment              Vision       Perception     Praxis      Cognition Arousal/Alertness: Lethargic, Suspect due to medications Behavior During Therapy: Flat affect Overall Cognitive Status: Difficult to assess  Exercises      Shoulder Instructions       General Comments Does not attend to R visual field despite maximal cueing. Eyes do not track beyond midline to the R. Impaired command following likely impacted by fatigue after med administration.    Pertinent Vitals/ Pain       Pain Assessment Pain Assessment: Faces Faces Pain Scale: Hurts little more Pain Location: headache Pain Descriptors / Indicators: Grimacing, Discomfort Pain Intervention(s): Premedicated before session, Limited activity within patient's tolerance, Monitored during session  Home Living                                          Prior Functioning/Environment               Frequency  Min 2X/week        Progress Toward Goals  OT Goals(current goals can now be found in the care plan section)  Progress towards OT goals: Progressing toward goals  Acute Rehab OT Goals Patient Stated Goal: No goal stated. OT Goal Formulation: With patient Time For Goal Achievement: 03/13/22 Potential to Achieve Goals: Good ADL Goals Pt Will Perform Grooming: sitting;with set-up Pt Will Perform Lower Body Dressing: with min assist;sitting/lateral leans;sit to/from stand;bed level Pt Will Transfer to Toilet: with min assist;bedside commode;stand pivot transfer;ambulating Additional ADL Goal #1: Pt will demonstrate ability to complete bed mobility with supervision and maintain dynamic balance at EOB for 5 minutes during ADLs. Additional ADL Goal #2: Pt will utilize focused gaze during ADLs/mobility with no more than min cueing to decrease nausea/dizziness during routine.  Plan Discharge plan remains appropriate;Frequency remains appropriate    Co-evaluation                 AM-PAC OT "6 Clicks" Daily Activity     Outcome Measure   Help from another person eating meals?: A Little Help from another person taking care of personal grooming?: A Little Help from another person toileting, which includes using toliet, bedpan, or urinal?: A Lot Help from another person bathing (including washing, rinsing, drying)?: A Lot Help from another person to put on and taking off regular upper body clothing?: A Lot Help from another person to put on and taking off regular lower body clothing?: A Lot 6 Click Score: 14    End of Session    OT Visit Diagnosis: Other abnormalities of gait and mobility (R26.89);Muscle weakness (generalized) (M62.81);Dizziness and giddiness (R42)   Activity Tolerance Patient limited by fatigue;Patient limited by lethargy   Patient Left in bed;with call bell/phone within reach;with bed alarm set;Other (comment)   Nurse Communication Mobility  status        Time: 6222-9798 OT Time Calculation (min): 16 min  Charges: OT General Charges $OT Visit: 1 Visit OT Treatments $Therapeutic Activity: 8-22 mins  Sundai Probert H. OTR/L Supplemental OT, Department of rehab services [phone]  Evie Crumpler R H. 03/17/2022, 2:00 PM

## 2022-02-28 NOTE — Progress Notes (Signed)
Arrived at patient's room for night round but patient already left for his procedure with neurology. Will attempt to revaluate patient after procedure. Last noted vitals listed below  Vitals:   03/08/2022 1113 03/11/2022 1612  BP: (!) 149/79 (!) 154/86  Pulse: 63 64  Resp: 18 16  Temp: 98.2 F (36.8 C) 98.3 F (36.8 C)  SpO2: 99%     Alen Bleacher, MD PGY-2, [facility] Resident  Please page [phone] with questions.

## 2022-02-28 NOTE — Progress Notes (Signed)
STROKE TEAM PROGRESS NOTE   SUBJECTIVE (INTERVAL HISTORY) No family is at the bedside.  Called by RN and primary team that pt neurologically getting worse. Per RN, this morning pt seems more lethargic but still moving all extremities. Around 11:30am, RN found him to have some left sided weakness but still moving OK. Stat MRI was ordered. However, in the afternoon, he went to MRI but found to have aspiration there and was sent back due to not able to lay flat. RN checked him again found that he had left hemiplegia and left gaze with respiratory distress.    OBJECTIVE Temp:  [97.7 F (36.5 C)-99.3 F (37.4 C)] 98.3 F (36.8 C) (10/07 1612) Pulse Rate:  [60-86] 64 (10/07 1612) Cardiac Rhythm: Normal sinus rhythm (10/07 0700) Resp:  [16-20] 16 (10/07 1612) BP: (125-154)/(72-93) 154/86 (10/07 1612) SpO2:  [98 %-100 %] 99 % (10/07 1113)  No results for input(s): "GLUCAP" in the last 168 hours. Recent Labs  Lab 03/16/2022 1145 03/10/2022 1254 02/27/22 0445  NA 139 140 140  K 3.2* 3.5 3.4*  CL 108 106 106  CO2 22  --  23  GLUCOSE 142* 140* 108*  BUN 28* 32* 17  CREATININE 1.06 1.10 1.05  CALCIUM 9.6  --  10.1   No results for input(s): "AST", "ALT", "ALKPHOS", "BILITOT", "PROT", "ALBUMIN" in the last 168 hours. Recent Labs  Lab 03/12/2022 1145 03/10/2022 1254 02/27/22 0445  WBC 5.0  --  4.4  NEUTROABS 1.9  --   --   HGB 13.3 13.9 13.6  HCT 40.2 41.0 38.8*  MCV 95.3  --  93.7  PLT 193  --  194   No results for input(s): "CKTOTAL", "CKMB", "CKMBINDEX", "TROPONINI" in the last 168 hours. No results for input(s): "LABPROT", "INR" in the last 72 hours. No results for input(s): "COLORURINE", "LABSPEC", "PHURINE", "GLUCOSEU", "HGBUR", "BILIRUBINUR", "KETONESUR", "PROTEINUR", "UROBILINOGEN", "NITRITE", "LEUKOCYTESUR" in the last 72 hours.  Invalid input(s): "APPERANCEUR"     Component Value Date/Time   CHOL 83 02/27/2022 0445   CHOL 102 02/18/2021 1111   TRIG 173 (H) 02/27/2022 0445    HDL 33 (L) 02/27/2022 0445   HDL 42 02/18/2021 1111   CHOLHDL 2.5 02/27/2022 0445   VLDL 35 02/27/2022 0445   LDLCALC 15 02/27/2022 0445   LDLCALC 39 02/18/2021 1111   Lab Results  Component Value Date   HGBA1C 6.3 (H) 02/27/2022      Component Value Date/Time   LABOPIA NONE DETECTED 03/18/2022 2257   COCAINSCRNUR NONE DETECTED 03/01/2022 2257   LABBENZ NONE DETECTED 03/18/2022 2257   AMPHETMU NONE DETECTED 03/15/2022 2257   THCU NONE DETECTED 02/27/2022 2257   LABBARB NONE DETECTED 03/19/2022 2257    No results for input(s): "ETH" in the last 168 hours.  I have personally reviewed the radiological images below and agree with the radiology interpretations.  ECHOCARDIOGRAM COMPLETE  Result Date: 02/27/2022    ECHOCARDIOGRAM REPORT   Patient Name:   [name] Date of Exam: 02/27/2022 Medical Rec #:  [id number]       Height:       67.0 in Accession #:    [id number]      Weight:       206.3 lb Date of Birth:  [birth year]       BSA:          2.049 m Patient Age:    66 years        BP:  118/69 mmHg Patient Gender: M               HR:           73 bpm. Exam Location:  Inpatient Procedure: 2D Echo, 3D Echo, Cardiac Doppler and Color Doppler Indications:     Stroke  History:         Patient has no prior history of Echocardiogram examinations.                  Stroke; Risk Factors:Current Smoker and Hypertension.  Sonographer:     Greer Pickerel Sonographer#2:   Bernadene Person RDCS Referring Phys:  Princella Ion A HENSEL Diagnosing Phys: Gwyndolyn Kaufman MD  Sonographer Comments: Image acquisition challenging due to respiratory motion. IMPRESSIONS  1. Left ventricular ejection fraction, by estimation, is 45 to 50%. Left ventricular ejection fraction by 3D volume is 47 %. The left ventricle has mildly decreased function. The left ventricle demonstrates global hypokinesis. There is mild concentric left ventricular hypertrophy. Left ventricular diastolic parameters are consistent with Grade I  diastolic dysfunction (impaired relaxation).  2. Right ventricular systolic function is normal. The right ventricular size is normal. Tricuspid regurgitation signal is inadequate for assessing PA pressure.  3. The mitral valve is grossly normal. Mild mitral valve regurgitation.  4. The aortic valve is tricuspid. Aortic valve regurgitation is not visualized. Aortic valve sclerosis is present, with no evidence of aortic valve stenosis.  5. The inferior vena cava is normal in size with <50% respiratory variability, suggesting right atrial pressure of 8 mmHg. Conclusion(s)/Recommendation(s): No intracardiac source of embolism detected on this transthoracic study. Consider a transesophageal echocardiogram to exclude cardiac source of embolism if clinically indicated. FINDINGS  Left Ventricle: Left ventricular ejection fraction, by estimation, is 45 to 50%. Left ventricular ejection fraction by 3D volume is 47 %. The left ventricle has mildly decreased function. The left ventricle demonstrates global hypokinesis. The left ventricular internal cavity size was normal in size. There is mild concentric left ventricular hypertrophy. Left ventricular diastolic parameters are consistent with Grade I diastolic dysfunction (impaired relaxation). Right Ventricle: The right ventricular size is normal. No increase in right ventricular wall thickness. Right ventricular systolic function is normal. Tricuspid regurgitation signal is inadequate for assessing PA pressure. Left Atrium: Left atrial size was normal in size. Right Atrium: Right atrial size was normal in size. Pericardium: There is no evidence of pericardial effusion. Mitral Valve: The mitral valve is grossly normal. Mild mitral valve regurgitation. Tricuspid Valve: The tricuspid valve is normal in structure. Tricuspid valve regurgitation is trivial. Aortic Valve: The aortic valve is tricuspid. Aortic valve regurgitation is not visualized. Aortic valve sclerosis is present,  with no evidence of aortic valve stenosis. Pulmonic Valve: The pulmonic valve was normal in structure. Pulmonic valve regurgitation is trivial. Aorta: The aortic root and ascending aorta are structurally normal, with no evidence of dilitation. Venous: The inferior vena cava is normal in size with less than 50% respiratory variability, suggesting right atrial pressure of 8 mmHg. IAS/Shunts: The atrial septum is grossly normal.  LEFT VENTRICLE PLAX 2D LVIDd:         4.20 cm         Diastology LVIDs:         2.90 cm         LV e' medial:    5.29 cm/s LV PW:         1.20 cm         LV E/e' medial:  15.7 LV IVS:        1.20 cm         LV e' lateral:   6.89 cm/s LVOT diam:     2.00 cm         LV E/e' lateral: 12.0 LV SV:         58 LV SV Index:   28 LVOT Area:     3.14 cm        3D Volume EF                                LV 3D EF:    Left                                             ventricul LV Volumes (MOD)                            ar LV vol d, MOD    95.2 ml                    ejection A2C:                                        fraction LV vol d, MOD    109.0 ml                   by 3D A4C:                                        volume is LV vol s, MOD    49.7 ml                    47 %. A2C: LV vol s, MOD    57.2 ml A4C:                           3D Volume EF: LV SV MOD A2C:   45.5 ml       3D EF:        47 % LV SV MOD A4C:   109.0 ml      LV EDV:       108 ml LV SV MOD BP:    48.4 ml       LV ESV:       57 ml                                LV SV:        51 ml RIGHT VENTRICLE RV S prime:     10.80 cm/s TAPSE (M-mode): 1.8 cm LEFT ATRIUM             Index        RIGHT ATRIUM           Index LA diam:        3.60 cm 1.76 cm/m   RA Area:     16.80 cm LA Vol (A2C):  55.0 ml 26.84 ml/m  RA Volume:   49.40 ml  24.11 ml/m LA Vol (A4C):   54.8 ml 26.74 ml/m LA Biplane Vol: 55.8 ml 27.23 ml/m  AORTIC VALVE LVOT Vmax:   91.70 cm/s LVOT Vmean:  61.300 cm/s LVOT VTI:    0.185 m  AORTA Ao Root diam: 3.60 cm Ao Asc diam:   3.40 cm MITRAL VALVE MV Area (PHT): 3.99 cm    SHUNTS MV Decel Time: 190 msec    Systemic VTI:  0.18 m MV E velocity: 82.80 cm/s  Systemic Diam: 2.00 cm Gwyndolyn Kaufman MD Electronically signed by Gwyndolyn Kaufman MD Signature Date/Time: 02/27/2022/12:32:09 PM    Final (Updated)    CT ANGIO HEAD NECK W WO CM  Result Date: 03/23/2022 CLINICAL DATA:  Stroke suspected. EXAM: CT ANGIOGRAPHY HEAD AND NECK TECHNIQUE: Multidetector CT imaging of the head and neck was performed using the standard protocol during bolus administration of intravenous contrast. Multiplanar CT image reconstructions and MIPs were obtained to evaluate the vascular anatomy. Carotid stenosis measurements (when applicable) are obtained utilizing NASCET criteria, using the distal internal carotid diameter as the denominator. RADIATION DOSE REDUCTION: This exam was performed according to the departmental dose-optimization program which includes automated exposure control, adjustment of the mA and/or kV according to patient size and/or use of iterative reconstruction technique. CONTRAST:  70m OMNIPAQUE IOHEXOL 350 MG/ML SOLN COMPARISON:  MRA 11/11/10 FINDINGS: CT HEAD FINDINGS Brain: Infarcts seen on same day MRI are not visualized on this exam. There is no evidence of hemorrhage. No extra-axial fluid collection. No hydrocephalus. Vascular: See below for vascular findings Skull: Normal. Negative for fracture or focal lesion. Sinuses/Orbits: There is polypoid mucosal thickening in the right maxillary sinus and an air-fluid level in the left maxillary sinus. There is a chronic fracture of the left lamina papyracea. There is also mucosal thickening involving the bilateral frontal sinuses. Other: There is extensive odontogenic disease with a soft tissue abscess along the left mandible measuring 1.5 x 0.3 cm (series 100, image 208). Review of the MIP images confirms the above findings CTA NECK FINDINGS Aortic arch: 2 vessel aortic arch. The proximal  aspect of the branch vessels are poorly assessed due to streak artifact from contrast bolus in the subclavian vein imaged portion shows no evidence of aneurysm or dissection. No significant stenosis of the major arch vessel origins. Right carotid system: No evidence of dissection, stenosis (50% or greater), or occlusion. Left carotid system: No evidence of dissection, stenosis (50% or greater), or occlusion. Vertebral arteries: The right vertebral artery is occluded at the origin. There is intermittent opacification in the V2 segment. There is opacification at the V4 and V3 segments. The non dominant left vertebral artery is contrast opacified to the V 3 segment. The V4 segment is small in caliber and may be intermittently occluded (series 11, image 148). 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 Other neck: Negative. Upper chest: Negative. Review of the MIP images confirms the above findings CTA HEAD FINDINGS Anterior circulation: No significant stenosis, proximal occlusion, aneurysm, or vascular malformation. Posterior circulation: P1 segments of bilateral PCAs are contrast opacified. There is non opacification of the P2 and distal PCA branches bilaterally. Venous sinuses: As permitted by contrast timing, patent. Anatomic variants: None Review of the MIP images confirms the above findings IMPRESSION: 1. 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 The V4 segment of the non dominant left vertebral artery is also likely intermittently occluded. Basilar artery and bilateral P1 segments are contrast opacified. 2. Non opacification of the P2 and distal PCA branches bilaterally. 3. 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 4. 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 Findings  were discussed with Dr. Ronnald Nian on 03/03/2022 at 2:49 PM. Electronically Signed   By: Marin Roberts M.D.   On: 03/11/2022 14:55   MR BRAIN WO CONTRAST  Result Date: 03/17/2022 CLINICAL DATA:  Stroke suspected.  Dizziness.  Fall yesterday. EXAM: MRI HEAD WITHOUT CONTRAST TECHNIQUE: Multiplanar, multiecho pulse sequences of the brain and surrounding structures were obtained without intravenous contrast. COMPARISON:  CT Neck 05/02/21 FINDINGS: Brain: There are multiple supra and infratentorial acute infarcts including in the right cerebellar hemisphere and right occipital lobe. This is likely an additional recent infarct in the left thalamus with mild hypointense signal on the ADC map (series 5, image 76). There are additional chronic appearing infarcts in the left occipital lobe (series 11, image 12), left frontal lobe, left parietal lobe, and the right cerebellum (series 11, image 6). No evidence of acute hemorrhage. No hydrocephalus. No extra-axial fluid collection., Vascular: Absent flow void in the V3 and V4 segments of the right vertebral artery, new from prior CT neck. Skull and upper cervical spine: Normal marrow signal. Sinuses/Orbits: Mucosal thickening bilateral maxillary, ethmoid, frontal sinuses. There is an air-fluid level in the left maxillary sinus, which can be seen in the setting of acute sinusitis; correlate with symptomatology. Other: None. IMPRESSION: 1. Posterior circulation acute infarcts in the right cerebellar hemisphere and right occipital lobe, as well as the left thalamus. Asymmetricall absent flow void in the V3 and V4 segments of the right vertebral artery is worrisome for occlusion and/or dissection, new from CT neck 05/02/21. Recommend further evaluation with CTA. No hemorrhage. 2. Additional chronic infarcts in the left occipital lobe, left frontal lobe, left parietal lobe, and the right cerebellum. 3. Paranasal sinus disease with an air-fluid level in the left maxillary sinus, which  can be seen in the setting of acute sinusitis. These results will be called to the ordering clinician or representative by the Radiologist Assistant, and communication documented in the PACS or [employer]. Electronically Signed   By: Marin Roberts M.D.   On: 02/25/2022 14:00     PHYSICAL EXAM  Temp:  [97.7 F (36.5 C)-99.3 F (37.4 C)] 98.3 F (36.8 C) (10/07 1612) Pulse Rate:  [60-86] 64 (10/07 1612) Resp:  [16-20] 16 (10/07 1612) BP: (125-154)/(72-93) 154/86 (10/07 1612) SpO2:  [98 %-100 %] 99 % (10/07 1113)  General - Well nourished, well developed, in mild respiratory distress.  Ophthalmologic - fundi not visualized due to noncooperation.  Cardiovascular - Regular rhythm and rate.  Neuro - drowsy sleepy briefly open eyes on voice, needed constant stimulation to keep awake. However, able to follow simple commands on the right hand and foot. Severe dysarthria with respiratory distress, intangible words with orientation questions, not able to name and repeat. Left gaze and not cross midline, not blinking to visual threat bilaterally, PERRL. Mild left facial droop. Tongue protrusion not cooperative. RUE and RLE spontaneous movement and follows commands. LUE and LLE slight withdraw to pain. Sensation, coordination not cooperative, but with spontaneous movement, LUE ataxia observed.     ASSESSMENT/PLAN [name] is a 67 y.o. male with history  of stroke and smoker admitted for dizziness, imbalance.  Had a similar episode 1 month ago lasted only few minutes.  No tPA given due to outside window.    Worsening neuro status, concerning for brainstem infarct Left gaze with left sided weakness, dysarthria and aspiration, progressed during the day Stat CT head to rule out bleeding Stat CTA head and neck with perfusion to rule out LVO May consider intervention if basilar artery occlusion present NPO for now IVF Close watch respiratory distress, consider CCM consult if  needed.  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 source with right VA and P2 occlusion CT no acute abnormality MRI showed scattered right cerebellum and right PCA small infarcts CTA head and neck showed right MCA occlusion with V2/V3 reconstitution, left V4 intermittently occluded, bilateral P2 occlusion 2D Echo EF 45 to 50% LDL 15 HgbA1c 6.3 UDS negative Lovenox for VTE prophylaxis aspirin 325 mg daily prior to admission, now on aspirin 325 mg daily and clopidogrel 75 mg daily DAPT for 3 months and then Plavix alone given severe intracranial stenosis/occlusion Patient counseled to be compliant with his antithrombotic medications Ongoing aggressive stroke risk factor management Therapy recommendations: CIR Disposition: Pending  History of stroke 10/2010 admitted for right-sided weakness, MRI showed stroke at left MCA and bilateral ACA.  MRI showed left VA occlusion.  Discharged on aspirin  Hypertension Stable now Avoid low BP Long term BP goal normotensive  Hyperlipidemia Home meds: Lipitor 20 LDL 15, goal < 70 Now decrease Lipitor to 10, no high intensity statin given low LDL Hold off now due to no po access Continue statin at discharge  Tobacco abuse Current smoker Smoking cessation counseling provided Nicotine patch provided Pt is willing to quit  Other Stroke Risk Factors Advanced age Obesity, Body mass index is 32.32 kg/m.   Other Active Problems   Hospital day # 2  Patient condition worsened today, has developed neuro decline, concerning for brainstem in farct. I discussed with teaching service. I spent extensive time with the patient, more than 50% of which was spent in counseling and coordination of care, reviewing test results, images and medication, and discussing the diagnosis, treatment plan and potential prognosis. This patient's care requiresreview of multiple databases, neurological assessment,  discussion with family, other specialists and medical decision making of high complexity.   Rosalin Hawking, MD PhD Stroke Neurology 02/24/2022 4:53 PM    To contact Stroke Continuity provider, please refer to [email or web address] After hours, contact General Neurology

## 2022-02-28 NOTE — Consult Note (Signed)
NAME:  [name], MRN:  [id number], DOB:  1955/09/02, LOS: 2 ADMISSION DATE:  03/05/2022, CONSULTATION DATE:  10/7 REFERRING MD:  Dr. Estanislado Pandy, CHIEF COMPLAINT:  stroke  History of Present Illness:  Patient is a 66 year old male with pertinent PMH of CVA 2012, HTN, alcohol abuse, tobacco abuse, prediabetes presents to [facility] on 10/5 with dizziness.  Patient had stroke in 2012 affecting his left MCA, left PCA, right PCA.  Since previous stroke had RUE weakness and LU/LL weakness.  Patient states he has been having ongoing dizziness over the past 5 to 6 months but on 10/3 he had sudden onset dizziness and lightheadedness.  Symptoms did not improve and came to [facility] ED on 10/5 for further work-up.  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  MRI showing posterior circulation acute infarcts in the right cerebellar hemisphere and right occipital lobe.  Neuro consulted.  Patient stable and admitted to floors.  Started on ASA, Plavix, statin.  Over the next few days patient stable and awaiting placement for CIR.  Later on 10/7 patient more lethargic and having left-sided weakness.  Also having left hemiplegia and left gaze.  MRI ordered.  When in MRI patient vomited and patient having some mild respiratory distress.  CXR showing RLL opacity likely aspiration.  Unable to obtain MRI.  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  Patient taken to IR for thrombectomy.  Patient going to ICU after procedure.  PCCM consulted.  Pertinent  Medical History   Past Medical History:  Diagnosis Date   Stroke [facility])      Significant Hospital Events: Including procedures, antibiotic start and stop dates in addition to other pertinent events   10/5: Admitted for stroke 10/7: Patient became more lethargic; basilar occlusion with penumbra; IR consulted and performed thrombectomy; intubated for procedure; PCCM consulted  Interim History / Subjective:   See above  Objective   Blood pressure (!) 154/86, pulse 64, temperature 98.3 F (36.8 C), resp. rate 16, height '5\' 7"'$  (1.702 m), weight 93.6 kg, SpO2 99 %.        Intake/Output Summary (Last 24 hours) at 03/08/2022 2020 Last data filed at 03/20/2022 1621 Gross per 24 hour  Intake 250 ml  Output 350 ml  Net -100 ml   Filed Weights   03/15/2022 2111  Weight: 93.6 kg    Examination: General: critically ill appearing on mech vent HEENT: MM pink/moist; ETT in place Neuro: sedated and recently paralyzed; pupils pin point b/l CV: s1s2, RRR, no m/r/g PULM:  coarse rhonchi BS bilaterally; on mech vent PRVC GI: soft, bsx4 active  Extremities: warm/dry, no edema  Skin: no rashes or lesions appreciated   Resolved Hospital Problem list     Assessment & Plan:  CVA: 10/5 CTA showing 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  MRI showing posterior circulation acute infarcts in the right cerebellar hemisphere and right occipital lobe. Basilar occlusion s/p thrombectomy 10/7 P: -neuro following; appreciate recs -Repeat imaging postprocedure per neuro IR -frequent neuro checks -limit sedating meds -Cleviprex/Neo for SBP goal 120-140 -statin, DAPT per neuro -Continue neuroprotective measures- normothermia, euglycemia, HOB greater than 30, head in neutral alignment, normocapnia, normoxia.  -scds for dvt ppx -PT/OT/SLP when appropriate  Postop mechanical ventilation Possible aspiration pneumonia: RLL opacity P: -Check CXR to confirm ETT position -We will order Unasyn for aspiration PPx -Send tracheal aspirate -LTVV strategy with tidal volumes of 6-8  cc/kg ideal body weight -check ABG and adjust settings accordingly  -Wean PEEP/FiO2 for SpO2 >92% -VAP bundle in place -Daily SAT and SBT -PAD protocol in place -wean sedation for RASS goal 0 to -1 -prn duoneb for wheezing  Hypokalemia P: -trend bmp and mag -replete electrolytes as needed  Elevated TSH: P: -check  t4 -consider starting synthroid  HTN P: -SBP goal as above -hold home losartan  Alcohol abuse Tobacco abuse P: -Thiamine, folic acid, MVI -Monitor for signs of withdrawal -Nicotine patch -Cessation counseling when appropriate  Dental abscess P: -Switch ABX to Unasyn from penicillin V -Will need dental follow-up outpatient    Best Practice (right click and "Reselect all SmartList Selections" daily)   Diet/type: NPO w/ meds via tube DVT prophylaxis: SCD GI prophylaxis: PPI Lines: N/A Foley:  Yes, and it is still needed Code Status:  full code Last date of multidisciplinary goals of care discussion [per primary]  Labs   CBC: Recent Labs  Lab 02/25/2022 1145 03/13/2022 1254 02/27/22 0445  WBC 5.0  --  4.4  NEUTROABS 1.9  --   --   HGB 13.3 13.9 13.6  HCT 40.2 41.0 38.8*  MCV 95.3  --  93.7  PLT 193  --  664    Basic Metabolic Panel: Recent Labs  Lab 03/24/2022 1145 03/24/2022 1254 02/27/22 0445  NA 139 140 140  K 3.2* 3.5 3.4*  CL 108 106 106  CO2 22  --  23  GLUCOSE 142* 140* 108*  BUN 28* 32* 17  CREATININE 1.06 1.10 1.05  CALCIUM 9.6  --  10.1   GFR: Estimated Creatinine Clearance: 75.5 mL/min (by C-G formula based on SCr of 1.05 mg/dL). Recent Labs  Lab 03/08/2022 1145 02/27/22 0445  WBC 5.0 4.4    Liver Function Tests: No results for input(s): "AST", "ALT", "ALKPHOS", "BILITOT", "PROT", "ALBUMIN" in the last 168 hours. No results for input(s): "LIPASE", "AMYLASE" in the last 168 hours. No results for input(s): "AMMONIA" in the last 168 hours.  ABG    Component Value Date/Time   PHART 7.45 02/24/2022 1720   PCO2ART 32 02/25/2022 1720   PO2ART 137 (H) 03/13/2022 1720   HCO3 22.2 03/23/2022 1720   TCO2 25 03/06/2022 1254   ACIDBASEDEF 1.0 02/25/2022 1720     Coagulation Profile: No results for input(s): "INR", "PROTIME" in the last 168 hours.  Cardiac Enzymes: No results for input(s): "CKTOTAL", "CKMB", "CKMBINDEX", "TROPONINI" in the  last 168 hours.  HbA1C: Hgb A1c MFr Bld  Date/Time Value Ref Range Status  02/27/2022 04:45 AM 6.3 (H) 4.8 - 5.6 % Final    Comment:    (NOTE) Pre diabetes:          5.7%-6.4%  Diabetes:              >6.4%  Glycemic control for   <7.0% adults with diabetes   02/18/2021 11:11 AM 6.0 (H) 4.8 - 5.6 % Final    Comment:             Prediabetes: 5.7 - 6.4          Diabetes: >6.4          Glycemic control for adults with diabetes: <7.0     CBG: Recent Labs  Lab 03/17/2022 1730  GLUCAP 123*    Review of Systems:   Patient is encephalopathic and/or intubated. Therefore history has been obtained from chart review.    Past Medical History:  He,  has a past  medical history of Stroke [facility]).   Surgical History:  History reviewed. No pertinent surgical history.   Social History:   reports that he has been smoking cigarettes. He has never used smokeless tobacco. He reports current alcohol use. He reports that he does not use drugs.   Family History:  His family history is not on file.   Allergies No Known Allergies   Home Medications  Prior to Admission medications   Medication Sig Start Date End Date Taking? Authorizing Provider  aspirin 325 MG tablet Take 325 mg by mouth daily.     Yes [provider]  atorvastatin (LIPITOR) 20 MG tablet TAKE 1 TABLET(20 MG) BY MOUTH DAILY 08/14/21  Yes Ezequiel Essex, MD  carboxymethylcellulose (REFRESH PLUS) 0.5 % SOLN 1 drop daily as needed (dry eyes).   Yes [provider]  cetirizine (ZYRTEC) 10 MG tablet Take 1 tablet (10 mg total) by mouth daily. Patient taking differently: Take 10 mg by mouth daily as needed for allergies. 12/04/21  Yes Alcus Dad, MD  ibuprofen (ADVIL) 200 MG tablet Take 800 mg by mouth every 6 (six) hours as needed for mild pain.   Yes [provider]  losartan-hydrochlorothiazide (HYZAAR) 50-12.5 MG tablet TAKE 1 TABLET BY MOUTH DAILY 12/01/21  Yes Alcus Dad, MD  Multiple  Vitamin (MULTIVITAMIN) capsule Take 1 capsule by mouth 2 (two) times a week.   Yes [provider]     Critical care time: 45 minutes    JD Rexene Agent [facility] 03/16/2022, 8:20 PM  Please see [email or web address] for pager details.  From 7A-7P if no response, please call [phone]. After hours, please call ELink [phone].

## 2022-02-28 NOTE — Procedures (Signed)
INR. Status post bilateral vertebral artery angiograms. Right CFA approach. Findings. 1.  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 2.  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  Status post endovascular revascularization of basilar artery, the posterior cerebral arteries and both anterior inferior cerebellar arteries with 3 passes of 4 mm x 40 mm Solitaire x arterial device and proximal aspiration, and 1 pass with a 3 mm x 20 mm Solitaire X retrieval device and contact aspiration. Status post angioplasty of the dominant right vertebral artery origin with a residual 50% stenosis. Post CT of the brain focal area of hyperattenuation in the region of the region of the fourth ventricle?  Parenchymal. With no evidence of hydrocephalus. 8 [place] Angio-Seal used for hemostasis of the right groin puncture site.  Distal pulses all intact. Patient left intubated due to his medical condition.  Arlean Hopping MD

## 2022-02-28 NOTE — Progress Notes (Signed)
Had stat CTA head and neck with CTP, it showed mid basilar occlusion with penumbra. Pt still drowsy, able to briefly open eyes on voice, left eye right ward gaze and left eye midline. Left UE increased muscle tone withdraw to pain, left LE slight withdraw to pain. Still has severe dysarthria and dysphagia. I talked with pt mom over the phone and discussed with her regarding benefit and risk of EVT in the setting of basilar artery occlusion, she is willing to proceed. Code IR activated and I have discussed with Dr. Estanislado Pandy and Dr. Leonel Ramsay over the phone. Will transport pt to IR suite.   Rosalin Hawking, MD PhD Stroke Neurology 03/10/2022 7:34 PM

## 2022-02-28 NOTE — Progress Notes (Addendum)
Physical Therapy Treatment Patient Details Name: [name] MRN: [id number] DOB: [birth year] Today's Date: 03/20/2022   History of Present Illness Pt is a 66 y/o male presenting on 10/5 with dizziness. 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 PMH includes: CVA 2012, HTN, alcohol/tobacco use, prediabetes.    PT Comments    Pt received in bed with head resting on R bed rail. Bed transitioned to chair position. Pt able to pull forward to sit using bilat bed rails min guard assist. Unable to attempt transition to EOB due to dizziness/nausea in full upright position. Pt able to perform general ROM exercises BLE. Pt given sip of thicken water while in chair position and immediately began coughing. Yankauer suction used to remove frothy mucous/saliva from mouth. SLP notified. HOB at 38 degrees at end of session.    Recommendations for follow up therapy are one component of a multi-disciplinary discharge planning process, led by the attending physician.  Recommendations may be updated based on patient status, additional functional criteria and insurance authorization.  Follow Up Recommendations  Acute inpatient rehab (3hours/day)     Assistance Recommended at Discharge Frequent or constant Supervision/Assistance  Patient can return home with the following A lot of help with walking and/or transfers;A lot of help with bathing/dressing/bathroom;Assistance with cooking/housework;Assist for transportation;Help with stairs or ramp for entrance   Equipment Recommendations  Other (comment) (defer to post acute)    Recommendations for Other Services       Precautions / Restrictions Precautions Precautions: Fall;Other (comment) Precaution Comments: Vertigo causing N/V     Mobility  Bed Mobility Overal bed mobility: Needs Assistance             General bed mobility comments: Bed placed in chair position. Pt able to pull  forward into sit using bilat lower bedrails with min guard assist.    Transfers                   General transfer comment: Unable to progress to OOB mobility due to dizziness. Projectile vomiting earlier today with rolling attempts.    Ambulation/Gait                   Stairs             Wheelchair Mobility    Modified Rankin (Stroke Patients Only) Modified Rankin (Stroke Patients Only) Pre-Morbid Rankin Score: No symptoms Modified Rankin: Severe disability     Balance Overall balance assessment: Needs assistance       Postural control: Right lateral lean                                  Cognition Arousal/Alertness: Lethargic, Suspect due to medications Behavior During Therapy: WFL for tasks assessed/performed Overall Cognitive Status: Difficult to assess                                 General Comments: appears [facility]        Exercises      General Comments General comments (skin integrity, edema, etc.): nystagmus noted with lateral eye movements      Pertinent Vitals/Pain Pain Assessment Pain Assessment: Faces Faces Pain Scale: Hurts little more Pain Location: headache Pain Descriptors / Indicators: Grimacing, Discomfort Pain Intervention(s): Premedicated before session, Limited activity within patient's tolerance, Monitored during session,  Repositioned    Home Living                          Prior Function            PT Goals (current goals can now be found in the care plan section) Acute Rehab PT Goals Patient Stated Goal: not stated Progress towards PT goals: Not progressing toward goals - comment (dizziness, nausea, vomiting)    Frequency    Min 2X/week      PT Plan Current plan remains appropriate    Co-evaluation              AM-PAC PT "6 Clicks" Mobility   Outcome Measure  Help needed turning from your back to your side while in a flat bed without using bedrails?:  A Little Help needed moving from lying on your back to sitting on the side of a flat bed without using bedrails?: A Little Help needed moving to and from a bed to a chair (including a wheelchair)?: Total Help needed standing up from a chair using your arms (e.g., wheelchair or bedside chair)?: Total Help needed to walk in hospital room?: Total Help needed climbing 3-5 steps with a railing? : Total 6 Click Score: 10    End of Session   Activity Tolerance: Treatment limited secondary to medical complications (Comment) (dizziness, nausea) Patient left: in bed;with call bell/phone within reach;with bed alarm set   PT Visit Diagnosis: Unsteadiness on feet (R26.81);Dizziness and giddiness (R42);Other symptoms and signs involving the nervous system (R29.898)     Time: 1856-3149 PT Time Calculation (min) (ACUTE ONLY): 17 min  Charges:  $Therapeutic Activity: 8-22 mins                     Lorrin Goodell, PT  Office # [phone] Pager [phone]    Lorriane Shire 02/25/2022, 1:12 PM

## 2022-03-01 ENCOUNTER — Inpatient Hospital Stay (HOSPITAL_COMMUNITY): Payer: Medicare Other

## 2022-03-01 DIAGNOSIS — I6302 Cerebral infarction due to thrombosis of basilar artery: Secondary | ICD-10-CM | POA: Diagnosis not present

## 2022-03-01 LAB — CBC
HCT: 37.1 % — ABNORMAL LOW (ref 39.0–52.0)
Hemoglobin: 12.5 g/dL — ABNORMAL LOW (ref 13.0–17.0)
MCH: 31.8 pg (ref 26.0–34.0)
MCHC: 33.7 g/dL (ref 30.0–36.0)
MCV: 94.4 fL (ref 80.0–100.0)
Platelets: 155 10*3/uL (ref 150–400)
RBC: 3.93 MIL/uL — ABNORMAL LOW (ref 4.22–5.81)
RDW: 16.1 % — ABNORMAL HIGH (ref 11.5–15.5)
WBC: 13.1 10*3/uL — ABNORMAL HIGH (ref 4.0–10.5)
nRBC: 0 % (ref 0.0–0.2)

## 2022-03-01 LAB — BASIC METABOLIC PANEL
Anion gap: 9 (ref 5–15)
Anion gap: 5 (ref 5–15)
BUN: 9 mg/dL (ref 8–23)
BUN: 8 mg/dL (ref 8–23)
CO2: 21 mmol/L — ABNORMAL LOW (ref 22–32)
CO2: 22 mmol/L (ref 22–32)
Calcium: 8.6 mg/dL — ABNORMAL LOW (ref 8.9–10.3)
Calcium: 7.8 mg/dL — ABNORMAL LOW (ref 8.9–10.3)
Chloride: 109 mmol/L (ref 98–111)
Chloride: 110 mmol/L (ref 98–111)
Creatinine, Ser: 0.96 mg/dL (ref 0.61–1.24)
Creatinine, Ser: 0.91 mg/dL (ref 0.61–1.24)
GFR, Estimated: >60 mL/min (ref >60)
GFR, Estimated: >60 mL/min (ref >60)
Glucose, Bld: 108 mg/dL — ABNORMAL HIGH (ref 70–99)
Glucose, Bld: 149 mg/dL — ABNORMAL HIGH (ref 70–99)
Potassium: 3.9 mmol/L (ref 3.5–5.1)
Potassium: 3.4 mmol/L — ABNORMAL LOW (ref 3.5–5.1)
Sodium: 139 mmol/L (ref 135–145)
Sodium: 137 mmol/L (ref 135–145)

## 2022-03-01 LAB — GLUCOSE, CAPILLARY
Glucose-Capillary: 112 mg/dL — ABNORMAL HIGH (ref 70–99)
Glucose-Capillary: 126 mg/dL — ABNORMAL HIGH (ref 70–99)
Glucose-Capillary: 168 mg/dL — ABNORMAL HIGH (ref 70–99)

## 2022-03-01 LAB — MAGNESIUM
Magnesium: 2 mg/dL (ref 1.7–2.4)
Magnesium: 1.7 mg/dL (ref 1.7–2.4)

## 2022-03-01 LAB — PHOSPHORUS: Phosphorus: 2.9 mg/dL (ref 2.5–4.6)

## 2022-03-01 LAB — T4, FREE: Free T4: 0.88 ng/dL (ref 0.61–1.12)

## 2022-03-01 LAB — TRIGLYCERIDES: Triglycerides: 133 mg/dL (ref <150)

## 2022-03-01 MED ORDER — ATORVASTATIN CALCIUM 10 MG PO TABS
10.0000 mg | ORAL_TABLET | Freq: Every day | ORAL | Status: DC
  Administered 2022-03-01: 10 mg
  Filled 2022-03-01: qty 1

## 2022-03-01 MED ORDER — PROCHLORPERAZINE EDISYLATE 10 MG/2ML IJ SOLN
10.0000 mg | Freq: Once | INTRAMUSCULAR | Status: AC
  Administered 2022-03-01: 10 mg via INTRAVENOUS
  Filled 2022-03-01: qty 2

## 2022-03-01 MED ORDER — INSULIN ASPART 100 UNIT/ML IJ SOLN
0.0000 [IU] | INTRAMUSCULAR | Status: DC
  Administered 2022-03-01: 2 [IU] via SUBCUTANEOUS
  Administered 2022-03-02: 3 [IU] via SUBCUTANEOUS

## 2022-03-01 MED ORDER — LACTATED RINGERS IV BOLUS
500.0000 mL | Freq: Once | INTRAVENOUS | Status: AC
  Administered 2022-03-02: 500 mL via INTRAVENOUS

## 2022-03-01 MED ORDER — MAGNESIUM SULFATE 2 GM/50ML IV SOLN
2.0000 g | Freq: Once | INTRAVENOUS | Status: AC
  Administered 2022-03-01: 2 g via INTRAVENOUS
  Filled 2022-03-01: qty 50

## 2022-03-01 MED ORDER — ORAL CARE MOUTH RINSE
15.0000 mL | OROMUCOSAL | Status: DC
  Administered 2022-03-01 – 2022-03-02 (×14): 15 mL via OROMUCOSAL

## 2022-03-01 MED ORDER — ORAL CARE MOUTH RINSE
15.0000 mL | OROMUCOSAL | Status: DC | PRN
  Administered 2022-03-01: 15 mL via OROMUCOSAL

## 2022-03-01 MED ORDER — LOSARTAN POTASSIUM 50 MG PO TABS
25.0000 mg | ORAL_TABLET | Freq: Every day | ORAL | Status: DC
  Administered 2022-03-01: 25 mg
  Filled 2022-03-01: qty 1

## 2022-03-01 MED ORDER — CHLORHEXIDINE GLUCONATE CLOTH 2 % EX PADS
6.0000 | MEDICATED_PAD | Freq: Every day | CUTANEOUS | Status: DC
  Administered 2022-03-01: 6 via TOPICAL

## 2022-03-01 MED ORDER — PROSOURCE TF20 ENFIT COMPATIBL EN LIQD
60.0000 mL | Freq: Every day | ENTERAL | Status: DC
  Administered 2022-03-01: 60 mL
  Filled 2022-03-01: qty 60

## 2022-03-01 MED ORDER — VITAL HIGH PROTEIN PO LIQD: 1000.0000 mL | ORAL | Status: DC

## 2022-03-01 MED ORDER — POTASSIUM CHLORIDE 10 MEQ/100ML IV SOLN
10.0000 meq | INTRAVENOUS | Status: AC
  Administered 2022-03-01 (×6): 10 meq via INTRAVENOUS
  Filled 2022-03-01 (×6): qty 100

## 2022-03-01 MED ORDER — LABETALOL HCL 5 MG/ML IV SOLN: 10.0000 mg | INTRAVENOUS | Status: DC | PRN

## 2022-03-01 NOTE — Progress Notes (Signed)
Supervising Physician: Luanne Bras  Patient Status:  [facility] - In-pt  Chief Complaint:  Code Stroke 1 day s/p b/l vertebral artery angiogram via R CFA approach with revascularization via mechanical thrombectomy and angioplasty of the dominant right vertebral artery  Subjective:  Lying in bed, intubated, non-responsive to voice or touch  Allergies: Patient has no known allergies.  Medications: Prior to Admission medications   Medication Sig Start Date End Date Taking? Authorizing Provider  aspirin 325 MG tablet Take 325 mg by mouth daily.     Yes [provider]  atorvastatin (LIPITOR) 20 MG tablet TAKE 1 TABLET(20 MG) BY MOUTH DAILY 08/14/21  Yes Ezequiel Essex, MD  carboxymethylcellulose (REFRESH PLUS) 0.5 % SOLN 1 drop daily as needed (dry eyes).   Yes [provider]  cetirizine (ZYRTEC) 10 MG tablet Take 1 tablet (10 mg total) by mouth daily. Patient taking differently: Take 10 mg by mouth daily as needed for allergies. 12/04/21  Yes Alcus Dad, MD  ibuprofen (ADVIL) 200 MG tablet Take 800 mg by mouth every 6 (six) hours as needed for mild pain.   Yes [provider]  losartan-hydrochlorothiazide (HYZAAR) 50-12.5 MG tablet TAKE 1 TABLET BY MOUTH DAILY 12/01/21  Yes Alcus Dad, MD  Multiple Vitamin (MULTIVITAMIN) capsule Take 1 capsule by mouth 2 (two) times a week.   Yes [provider]     Vital Signs: BP 113/71   Pulse 88   Temp 99.2 F (37.3 C) (Axillary)   Resp 13   Ht '5\' 7"'$  (1.702 m)   Wt 206 lb 5.6 oz (93.6 kg)   SpO2 95%   BMI 32.32 kg/m   Physical Exam Vitals reviewed.  Constitutional:      Interventions: He is sedated and intubated.  Cardiovascular:     Pulses: Normal pulses.     Comments: R CFA soft and not bleeding.  No pseudoaneurysm.  Appropriately dressed Pulmonary:     Effort: He is intubated.  Skin:    General: Skin is warm and dry.  Neurological:     Mental Status: He is unresponsive.      Imaging: MR ANGIO HEAD WO CONTRAST  Result Date: 03/01/2022 CLINICAL DATA:  Stroke follow-up EXAM: MRI HEAD WITHOUT CONTRAST MRA HEAD WITHOUT CONTRAST TECHNIQUE: Multiplanar, multi-echo pulse sequences of the brain and surrounding structures were acquired without intravenous contrast. Angiographic images of the Circle of Willis were acquired using MRA technique without intravenous contrast. COMPARISON:  CT and CTA from yesterday.  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 Bilateral midbrain and right para median pontine infarcts. Left pontine medullary junction infarct. Tiny acute infarcts in the bilateral thalamus and right occipital cortex. There is cerebellar swelling with crowding of the lower fourth ventricle, but no hydrocephalus. Brain atrophy. Remote cortically based infarcts along the left cerebral convexity. Some petechial blood products at the level of cerebellar infarction. Some chronic blood products associated with prior infarcts. Vascular: Arterial findings below.  Preserved flow voids. Skull and upper cervical spine: Normal marrow signal Sinuses/Orbits: Extensive paranasal sinus opacification. MRA HEAD FINDINGS Anterior circulation: The carotid arteries and its branches are smoothly contoured and diffusely patent. Posterior circulation: Robust flow in the right vertebral and basilar arteries. Bilateral PCA heterogeneity which could be residual clot after recent aspiration, versus underlying atherosclerosis/vasculopathy. Left vertebral artery ends in the PICA, essentially stable from a 2012 intracranial MRA. IMPRESSION: Brain MRI: 1. Progressed and extensive bilateral cerebellar  infarction with patchy infarcts in the brainstem and supratentorial posterior circulation. 2. Swelling narrows the lower fourth ventricle without hydrocephalus. 3. Remote left MCA/watershed territory infarct. MRA: Re-established robust  flow in the right vertebral and basilar arteries. Bilateral PCA luminal irregularity could be from recent thrombus or underlying atherosclerosis. Electronically Signed   By: Jorje Guild M.D.   On: 03/01/2022 05:02   MR BRAIN WO CONTRAST  Result Date: 03/01/2022 CLINICAL DATA:  Stroke follow-up EXAM: MRI HEAD WITHOUT CONTRAST MRA HEAD WITHOUT CONTRAST TECHNIQUE: Multiplanar, multi-echo pulse sequences of the brain and surrounding structures were acquired without intravenous contrast. Angiographic images of the Circle of Willis were acquired using MRA technique without intravenous contrast. COMPARISON:  CT and CTA from yesterday.  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 Bilateral midbrain and right para median pontine infarcts. Left pontine medullary junction infarct. Tiny acute infarcts in the bilateral thalamus and right occipital cortex. There is cerebellar swelling with crowding of the lower fourth ventricle, but no hydrocephalus. Brain atrophy. Remote cortically based infarcts along the left cerebral convexity. Some petechial blood products at the level of cerebellar infarction. Some chronic blood products associated with prior infarcts. Vascular: Arterial findings below.  Preserved flow voids. Skull and upper cervical spine: Normal marrow signal Sinuses/Orbits: Extensive paranasal sinus opacification. MRA HEAD FINDINGS Anterior circulation: The carotid arteries and its branches are smoothly contoured and diffusely patent. Posterior circulation: Robust flow in the right vertebral and basilar arteries. Bilateral PCA heterogeneity which could be residual clot after recent aspiration, versus underlying atherosclerosis/vasculopathy. Left vertebral artery ends in the PICA, essentially stable from a 2012 intracranial MRA. IMPRESSION: Brain MRI: 1. Progressed and extensive bilateral cerebellar infarction with patchy infarcts in  the brainstem and supratentorial posterior circulation. 2. Swelling narrows the lower fourth ventricle without hydrocephalus. 3. Remote left MCA/watershed territory infarct. MRA: Re-established robust flow in the right vertebral and basilar arteries. Bilateral PCA luminal irregularity could be from recent thrombus or underlying atherosclerosis. Electronically Signed   By: Jorje Guild M.D.   On: 03/01/2022 05:02   Portable Chest x-ray  Result Date: 03/14/2022 CLINICAL DATA:  ETT and OG tube placement EXAM: PORTABLE CHEST 1 VIEW COMPARISON:  02/24/2022 at 1727 hours FINDINGS: Endotracheal tube terminates 3.5 cm above the carina. Mild patchy right mid/lower lung opacity, suspicious for aspiration/pneumonia, with small right pleural effusion. Left lung is clear. No pneumothorax. The heart is top-normal in size. Enteric tube terminates in the gastric cardia with its side port in the distal esophagus. Consider advancement 6 cm. IMPRESSION: Endotracheal tube terminates 3.5 cm above the carina. Mild patchy right mid/lower lung opacity, suspicious for aspiration/pneumonia, with small right pleural effusion. Enteric tube terminates in the gastric cardia with side port in the distal esophagus. Consider advancement 6 cm. Electronically Signed   By: Julian Hy M.D.   On: 03/24/2022 23:20   CT ANGIO HEAD NECK W WO CM W PERF  Result Date: 03/07/2022 CLINICAL DATA:  Neuro deficit, acute, stroke suspected. Progressive left lesion and left gaze. Respiratory distress. EXAM: CT ANGIOGRAPHY HEAD AND NECK TECHNIQUE: Multidetector CT imaging of the head and neck was performed using the standard protocol during bolus administration of intravenous contrast. Multiplanar CT image reconstructions and MIPs were obtained to evaluate the vascular anatomy. Carotid stenosis measurements (when applicable) are obtained utilizing NASCET criteria, using the distal internal carotid diameter as the denominator. RADIATION DOSE  REDUCTION: This exam was performed according to the  departmental dose-optimization program which includes automated exposure control, adjustment of the mA and/or kV according to patient size and/or use of iterative reconstruction technique. CONTRAST:  113m OMNIPAQUE IOHEXOL 350 MG/ML SOLN COMPARISON:  MR head without contrast 02/25/2022. CTA head and neck 03/06/2022. FINDINGS: CT HEAD FINDINGS Brain: Remote left MCA encephalomalacia is stable. Remote left parietal lobe infarct is stable. Progressive hypoattenuation is present in the right cerebellum beyond the acute infarct noted on the previous MRI. Right occipital lobe infarct scratched at the evolving right occipital lobe infarct is stable. No acute right MCA territory infarct is present. Insular ribbon is normal. Basal ganglia is within normal limits. Mild generalized atrophy is otherwise stable. The ventricles are of normal size. No significant extraaxial fluid collection is present. Vascular: No hyperdense vessel or unexpected calcification. Skull: Calvarium is intact. No focal lytic or blastic lesions are present. No significant extracranial soft tissue lesion is present. Sinuses/Orbits: Mild mucosal thickening present in the posterior ethmoid air cells. Moderate mucosal thickening is noted in the frontal sinuses. A remote left orbital blowout fracture is again noted. The paranasal sinuses and mastoid air cells are otherwise clear. Globes and orbits are otherwise within normal limits. Review of the MIP images confirms the above findings CTA NECK FINDINGS Aortic arch: Common origin of the left common carotid artery and innominate artery again noted. No significant stenosis is present the great vessel origins. Right carotid system: The right common carotid artery is within normal limits. Bifurcation is unremarkable. The cervical right ICA is within normal limits. Left carotid system: The left common carotid artery is within normal limits. Atherosclerotic  changes are present at the proximal left ICA without a significant stenosis. Cervical left ICA is otherwise normal. Vertebral arteries: The dominant right vertebral artery is occluded at its origin. Hypoplastic left vertebral artery is stable. There is some reconstitution of the right vertebral artery at the C3 level. The V3 segment is within normal limits. Skeleton: Degenerative changes of the cervical spine are stable. Other neck: Soft tissues the neck are otherwise unremarkable. Salivary glands are within normal limits. Thyroid is normal. No significant adenopathy is present. No focal mucosal or submucosal lesions are present. Upper chest: The lung apices are clear. Thoracic inlet is within normal limits. Review of the MIP images confirms the above findings CTA HEAD FINDINGS Anterior circulation: The internal carotid arteries are within normal limits through the high cervical segments. The A1 and M1 segments are normal. Anterior communicating artery patent. The MCA bifurcations are normal bilaterally. The ACA and MCA branch vessels are within normal limits. Posterior circulation: The right vertebral artery becomes the basilar artery. The hypoplastic left vertebral artery terminates at the PICA. A progressive high-grade stenosis or occlusion is present in the mid basilar artery. Posterior cerebral arteries originate from the basilar tip. Proximal P2 segment occlusions are present. There is some collateral flow into the PCA territories. Venous sinuses: The dural sinuses are patent. The straight sinus and deep cerebral veins are intact. Cortical veins are within normal limits. No significant vascular malformation is evident. Anatomic variants: None Review of the MIP images confirms the above findings IMPRESSION: 1. 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 2. Proximal P2 segment occlusions bilaterally. 3. Progressive hypoattenuation in the right cerebellum beyond the acute infarct noted  on the previous MRI. 4. Stable appearance of evolving right occipital lobe infarct. 5. Stable remote left MCA and left parietal lobe infarcts. 6. 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 7. Hypoplastic left vertebral artery terminates at the PICA. 8. Stable mild atherosclerotic changes at the proximal left ICA without significant stenosis. Electronically Signed   By: San Morelle M.D.   On: 03/13/2022 19:39   DG CHEST PORT 1 VIEW  Result Date: 03/19/2022 CLINICAL DATA:  Aspiration EXAM: PORTABLE CHEST 1 VIEW COMPARISON:  Chest x-ray November 10, 2010 FINDINGS: Slightly widened mediastinal contours, likely positional. Normal heart size. Right basilar linear and heterogeneous pulmonary opacity. No left focal pulmonary opacity. No large pleural effusion or pneumothorax. No acute osseous abnormality. The visualized upper abdomen is unremarkable. IMPRESSION: Right basilar pulmonary opacity, possibly aspiration or pneumonia. Electronically Signed   By: Beryle Flock M.D.   On: 02/27/2022 17:39   ECHOCARDIOGRAM COMPLETE  Result Date: 02/27/2022    ECHOCARDIOGRAM REPORT   Patient Name:   [name] Date of Exam: 02/27/2022 Medical Rec #:  [id number]       Height:       67.0 in Accession #:    [id number]      Weight:       206.3 lb Date of Birth:  1955-12-03       BSA:          2.049 m Patient Age:    89 years        BP:           118/69 mmHg Patient Gender: M               HR:           73 bpm. Exam Location:  Inpatient Procedure: 2D Echo, 3D Echo, Cardiac Doppler and Color Doppler Indications:     Stroke  History:         Patient has no prior history of Echocardiogram examinations.                  Stroke; Risk Factors:Current Smoker and Hypertension.  Sonographer:     Greer Pickerel Sonographer#2:   Bernadene Person RDCS Referring Phys:  Princella Ion A HENSEL Diagnosing Phys: Gwyndolyn Kaufman MD  Sonographer Comments: Image acquisition  challenging due to respiratory motion. IMPRESSIONS  1. Left ventricular ejection fraction, by estimation, is 45 to 50%. Left ventricular ejection fraction by 3D volume is 47 %. The left ventricle has mildly decreased function. The left ventricle demonstrates global hypokinesis. There is mild concentric left ventricular hypertrophy. Left ventricular diastolic parameters are consistent with Grade I diastolic dysfunction (impaired relaxation).  2. Right ventricular systolic function is normal. The right ventricular size is normal. Tricuspid regurgitation signal is inadequate for assessing PA pressure.  3. The mitral valve is grossly normal. Mild mitral valve regurgitation.  4. The aortic valve is tricuspid. Aortic valve regurgitation is not visualized. Aortic valve sclerosis is present, with no evidence of aortic valve stenosis.  5. The inferior vena cava is normal in size with <50% respiratory variability, suggesting right atrial pressure of 8 mmHg. Conclusion(s)/Recommendation(s): No intracardiac source of embolism detected on this transthoracic study. Consider a transesophageal echocardiogram to exclude cardiac source of embolism if clinically indicated. FINDINGS  Left Ventricle: Left ventricular ejection fraction, by estimation, is 45 to 50%. Left ventricular ejection fraction by 3D volume is 47 %. The left ventricle has mildly decreased function. The left ventricle demonstrates global hypokinesis. The left ventricular internal cavity size was normal in size. There is mild concentric left ventricular hypertrophy. Left ventricular diastolic parameters are consistent with Grade I  diastolic dysfunction (impaired relaxation). Right Ventricle: The right ventricular size is normal. No increase in right ventricular wall thickness. Right ventricular systolic function is normal. Tricuspid regurgitation signal is inadequate for assessing PA pressure. Left Atrium: Left atrial size was normal in size. Right Atrium: Right  atrial size was normal in size. Pericardium: There is no evidence of pericardial effusion. Mitral Valve: The mitral valve is grossly normal. Mild mitral valve regurgitation. Tricuspid Valve: The tricuspid valve is normal in structure. Tricuspid valve regurgitation is trivial. Aortic Valve: The aortic valve is tricuspid. Aortic valve regurgitation is not visualized. Aortic valve sclerosis is present, with no evidence of aortic valve stenosis. Pulmonic Valve: The pulmonic valve was normal in structure. Pulmonic valve regurgitation is trivial. Aorta: The aortic root and ascending aorta are structurally normal, with no evidence of dilitation. Venous: The inferior vena cava is normal in size with less than 50% respiratory variability, suggesting right atrial pressure of 8 mmHg. IAS/Shunts: The atrial septum is grossly normal.  LEFT VENTRICLE PLAX 2D LVIDd:         4.20 cm         Diastology LVIDs:         2.90 cm         LV e' medial:    5.29 cm/s LV PW:         1.20 cm         LV E/e' medial:  15.7 LV IVS:        1.20 cm         LV e' lateral:   6.89 cm/s LVOT diam:     2.00 cm         LV E/e' lateral: 12.0 LV SV:         58 LV SV Index:   28 LVOT Area:     3.14 cm        3D Volume EF                                LV 3D EF:    Left                                             ventricul LV Volumes (MOD)                            ar LV vol d, MOD    95.2 ml                    ejection A2C:                                        fraction LV vol d, MOD    109.0 ml                   by 3D A4C:                                        volume is LV vol s, MOD    49.7 ml  47 %. A2C: LV vol s, MOD    57.2 ml A4C:                           3D Volume EF: LV SV MOD A2C:   45.5 ml       3D EF:        47 % LV SV MOD A4C:   109.0 ml      LV EDV:       108 ml LV SV MOD BP:    48.4 ml       LV ESV:       57 ml                                LV SV:        51 ml RIGHT VENTRICLE RV S prime:     10.80 cm/s TAPSE (M-mode):  1.8 cm LEFT ATRIUM             Index        RIGHT ATRIUM           Index LA diam:        3.60 cm 1.76 cm/m   RA Area:     16.80 cm LA Vol (A2C):   55.0 ml 26.84 ml/m  RA Volume:   49.40 ml  24.11 ml/m LA Vol (A4C):   54.8 ml 26.74 ml/m LA Biplane Vol: 55.8 ml 27.23 ml/m  AORTIC VALVE LVOT Vmax:   91.70 cm/s LVOT Vmean:  61.300 cm/s LVOT VTI:    0.185 m  AORTA Ao Root diam: 3.60 cm Ao Asc diam:  3.40 cm MITRAL VALVE MV Area (PHT): 3.99 cm    SHUNTS MV Decel Time: 190 msec    Systemic VTI:  0.18 m MV E velocity: 82.80 cm/s  Systemic Diam: 2.00 cm Gwyndolyn Kaufman MD Electronically signed by Gwyndolyn Kaufman MD Signature Date/Time: 02/27/2022/12:32:09 PM    Final (Updated)    CT ANGIO HEAD NECK W WO CM  Result Date: 03/04/2022 CLINICAL DATA:  Stroke suspected. EXAM: CT ANGIOGRAPHY HEAD AND NECK TECHNIQUE: Multidetector CT imaging of the head and neck was performed using the standard protocol during bolus administration of intravenous contrast. Multiplanar CT image reconstructions and MIPs were obtained to evaluate the vascular anatomy. Carotid stenosis measurements (when applicable) are obtained utilizing NASCET criteria, using the distal internal carotid diameter as the denominator. RADIATION DOSE REDUCTION: This exam was performed according to the departmental dose-optimization program which includes automated exposure control, adjustment of the mA and/or kV according to patient size and/or use of iterative reconstruction technique. CONTRAST:  2m OMNIPAQUE IOHEXOL 350 MG/ML SOLN COMPARISON:  MRA 11/11/10 FINDINGS: CT HEAD FINDINGS Brain: Infarcts seen on same day MRI are not visualized on this exam. There is no evidence of hemorrhage. No extra-axial fluid collection. No hydrocephalus. Vascular: See below for vascular findings Skull: Normal. Negative for fracture or focal lesion. Sinuses/Orbits: There is polypoid mucosal thickening in the right maxillary sinus and an air-fluid level in the left maxillary  sinus. There is a chronic fracture of the left lamina papyracea. There is also mucosal thickening involving the bilateral frontal sinuses. Other: There is extensive odontogenic disease with a soft tissue abscess along the left mandible measuring 1.5 x 0.3 cm (series 100, image 208). Review of the MIP images confirms the above findings CTA NECK FINDINGS Aortic arch: 2 vessel  aortic arch. The proximal aspect of the branch vessels are poorly assessed due to streak artifact from contrast bolus in the subclavian vein imaged portion shows no evidence of aneurysm or dissection. No significant stenosis of the major arch vessel origins. Right carotid system: No evidence of dissection, stenosis (50% or greater), or occlusion. Left carotid system: No evidence of dissection, stenosis (50% or greater), or occlusion. Vertebral arteries: The right vertebral artery is occluded at the origin. There is intermittent opacification in the V2 segment. There is opacification at the V4 and V3 segments. The non dominant left vertebral artery is contrast opacified to the V 3 segment. The V4 segment is small in caliber and may be intermittently occluded (series 11, image 148). 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 Other neck: Negative. Upper chest: Negative. Review of the MIP images confirms the above findings CTA HEAD FINDINGS Anterior circulation: No significant stenosis, proximal occlusion, aneurysm, or vascular malformation. Posterior circulation: P1 segments of bilateral PCAs are contrast opacified. There is non opacification of the P2 and distal PCA branches bilaterally. Venous sinuses: As permitted by contrast timing, patent. Anatomic variants: None Review of the MIP images confirms the above findings IMPRESSION: 1. 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 The V4 segment of the  non dominant left vertebral artery is also likely intermittently occluded. Basilar artery and bilateral P1 segments are contrast opacified. 2. Non opacification of the P2 and distal PCA branches bilaterally. 3. 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 4. 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 Findings were discussed with Dr. Ronnald Nian on 03/20/2022 at 2:49 PM. Electronically Signed   By: Marin Roberts M.D.   On: 03/16/2022 14:55   MR BRAIN WO CONTRAST  Result Date: 03/14/2022 CLINICAL DATA:  Stroke suspected.  Dizziness.  Fall yesterday. EXAM: MRI HEAD WITHOUT CONTRAST TECHNIQUE: Multiplanar, multiecho pulse sequences of the brain and surrounding structures were obtained without intravenous contrast. COMPARISON:  CT Neck 05/02/21 FINDINGS: Brain: There are multiple supra and infratentorial acute infarcts including in the right cerebellar hemisphere and right occipital lobe. This is likely an additional recent infarct in the left thalamus with mild hypointense signal on the ADC map (series 5, image 76). There are additional chronic appearing infarcts in the left occipital lobe (series 11, image 12), left frontal lobe, left parietal lobe, and the right cerebellum (series 11, image 6). No evidence of acute hemorrhage. No hydrocephalus. No extra-axial fluid collection., Vascular: Absent flow void in the V3 and V4 segments of the right vertebral artery, new from prior CT neck. Skull and upper cervical spine: Normal marrow signal. Sinuses/Orbits: Mucosal thickening bilateral maxillary, ethmoid, frontal sinuses. There is an air-fluid level in the left maxillary sinus, which can be seen in the setting of acute sinusitis; correlate with symptomatology. Other: None. IMPRESSION: 1. Posterior circulation acute infarcts in the right cerebellar hemisphere and right occipital lobe, as well as the left thalamus. Asymmetricall absent  flow void in the V3 and V4 segments of the right vertebral artery is worrisome for occlusion and/or dissection, new from CT neck 05/02/21. Recommend further evaluation with CTA. No hemorrhage. 2. Additional chronic infarcts in the left occipital lobe, left frontal lobe, left parietal lobe, and the right cerebellum. 3. Paranasal sinus disease with an air-fluid level in  the left maxillary sinus, which can be seen in the setting of acute sinusitis. These results will be called to the ordering clinician or representative by the Radiologist Assistant, and communication documented in the PACS or [employer]. Electronically Signed   By: Marin Roberts M.D.   On: 02/22/2022 14:00    Labs:  CBC: Recent Labs    03/08/2022 1145 02/25/2022 1254 02/27/22 0445 03/13/2022 2329 03/05/2022 2343 03/01/22 1000  WBC 5.0  --  4.4 4.6  --  13.1*  HGB 13.3   < > 13.6 12.9* 11.9* 12.5*  HCT 40.2   < > 38.8* 40.1 35.0* 37.1*  PLT 193  --  194 170  --  155   < > = values in this interval not displayed.    COAGS: No results for input(s): "INR", "APTT" in the last 8760 hours.  BMP: Recent Labs    03/16/2022 1145 03/13/2022 1254 02/27/22 0445 03/12/2022 2328 03/17/2022 2343 03/01/22 1000  NA 139 140 140 139 141 137  K 3.2* 3.5 3.4* 3.9 3.5 3.4*  CL 108 106 106 109  --  110  CO2 22  --  23 21*  --  22  GLUCOSE 142* 140* 108* 108*  --  149*  BUN 28* 32* 17 9  --  8  CALCIUM 9.6  --  10.1 8.6*  --  7.8*  CREATININE 1.06 1.10 1.05 0.96  --  0.91  GFRNONAA >60  --  >60 >60  --  >60      Assessment and Plan: 1 day s/p b/l vertebral artery angiogram via R CFA approach with revascularization via mechanical thrombectomy and angioplasty of the dominant right vertebral artery --still intubated and not responsive --femoral access site unremarkable --will see how he does with time --defer to stroke team for further management  NIR to follow   Electronically Signed: Pasty Spillers, PA 03/01/2022, 12:27 PM   I  spent a total of 15 Minutes at the the patient's bedside AND on the patient's hospital floor or unit, greater than 50% of which was counseling/coordinating care for stroke management

## 2022-03-01 NOTE — Progress Notes (Addendum)
STROKE TEAM PROGRESS NOTE   SUBJECTIVE (INTERVAL HISTORY) Family is at the bedside. RN at the bedside. Cleviprex @ '2mg'$ . Vomiting last pm. Having PVC this am, will check las and EKG.  Off sedation fentanyl off at 0300, Prop off @ 0600.   Dysconjugate eyes, Pupils small and sluggish, some spontaneous eye movements, localizing on left no movement on riight withdraw alon left arm and leg withdraw on right leg no movement on right arm  K 3.4, Mg 1.7 replace per pharmacy and recheck in the am  Will start losartan '25mg'$  po today. Continue with BP control 120-140  OBJECTIVE Temp:  [96.9 F (36.1 C)-98.8 F (37.1 C)] 98.8 F (37.1 C) (10/08 0800) Pulse Rate:  [63-108] 77 (10/08 0715) Cardiac Rhythm: Normal sinus rhythm (10/08 0400) Resp:  [10-20] 16 (10/08 0715) BP: (107-176)/(72-103) 145/87 (10/08 0715) SpO2:  [95 %-100 %] 98 % (10/08 0755) FiO2 (%):  [40 %-50 %] 40 % (10/08 0755)  Recent Labs  Lab 02/23/2022 1730  GLUCAP 123*   Recent Labs  Lab 03/17/2022 1145 02/27/2022 1254 02/27/22 0445 03/13/2022 2328 03/21/2022 2343  NA 139 140 140 139 141  K 3.2* 3.5 3.4* 3.9 3.5  CL 108 106 106 109  --   CO2 22  --  23 21*  --   GLUCOSE 142* 140* 108* 108*  --   BUN 28* 32* 17 9  --   CREATININE 1.06 1.10 1.05 0.96  --   CALCIUM 9.6  --  10.1 8.6*  --   MG  --   --   --  2.0  --     No results for input(s): "AST", "ALT", "ALKPHOS", "BILITOT", "PROT", "ALBUMIN" in the last 168 hours. Recent Labs  Lab 03/08/2022 1145 03/06/2022 1254 02/27/22 0445 03/21/2022 2329 03/19/2022 2343  WBC 5.0  --  4.4 4.6  --   NEUTROABS 1.9  --   --  2.5  --   HGB 13.3 13.9 13.6 12.9* 11.9*  HCT 40.2 41.0 38.8* 40.1 35.0*  MCV 95.3  --  93.7 99.5  --   PLT 193  --  194 170  --     No results for input(s): "CKTOTAL", "CKMB", "CKMBINDEX", "TROPONINI" in the last 168 hours. No results for input(s): "LABPROT", "INR" in the last 72 hours. No results for input(s): "COLORURINE", "LABSPEC", "PHURINE", "GLUCOSEU",  "HGBUR", "BILIRUBINUR", "KETONESUR", "PROTEINUR", "UROBILINOGEN", "NITRITE", "LEUKOCYTESUR" in the last 72 hours.  Invalid input(s): "APPERANCEUR"     Component Value Date/Time   CHOL 83 02/27/2022 0445   CHOL 102 02/18/2021 1111   TRIG 133 03/07/2022 2329   HDL 33 (L) 02/27/2022 0445   HDL 42 02/18/2021 1111   CHOLHDL 2.5 02/27/2022 0445   VLDL 35 02/27/2022 0445   LDLCALC 15 02/27/2022 0445   LDLCALC 39 02/18/2021 1111   Lab Results  Component Value Date   HGBA1C 6.3 (H) 02/27/2022      Component Value Date/Time   LABOPIA NONE DETECTED 03/24/2022 2257   COCAINSCRNUR NONE DETECTED 03/23/2022 2257   LABBENZ NONE DETECTED 03/06/2022 2257   AMPHETMU NONE DETECTED 03/16/2022 2257   THCU NONE DETECTED 03/05/2022 2257   LABBARB NONE DETECTED 03/24/2022 2257    No results for input(s): "ETH" in the last 168 hours.  I have personally reviewed the radiological images below and agree with the radiology interpretations.  MR ANGIO HEAD WO CONTRAST  Result Date: 03/01/2022 CLINICAL DATA:  Stroke follow-up EXAM: MRI HEAD WITHOUT CONTRAST MRA HEAD WITHOUT CONTRAST TECHNIQUE:  Multiplanar, multi-echo pulse sequences of the brain and surrounding structures were acquired without intravenous contrast. Angiographic images of the Circle of Willis were acquired using MRA technique without intravenous contrast. COMPARISON:  CT and CTA from yesterday.  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 Bilateral midbrain and right para median pontine infarcts. Left pontine medullary junction infarct. Tiny acute infarcts in the bilateral thalamus and right occipital cortex. There is cerebellar swelling with crowding of the lower fourth ventricle, but no hydrocephalus. Brain atrophy. Remote cortically based infarcts along the left cerebral convexity. Some petechial blood products at the level of cerebellar infarction. Some chronic blood  products associated with prior infarcts. Vascular: Arterial findings below.  Preserved flow voids. Skull and upper cervical spine: Normal marrow signal Sinuses/Orbits: Extensive paranasal sinus opacification. MRA HEAD FINDINGS Anterior circulation: The carotid arteries and its branches are smoothly contoured and diffusely patent. Posterior circulation: Robust flow in the right vertebral and basilar arteries. Bilateral PCA heterogeneity which could be residual clot after recent aspiration, versus underlying atherosclerosis/vasculopathy. Left vertebral artery ends in the PICA, essentially stable from a 2012 intracranial MRA. IMPRESSION: Brain MRI: 1. Progressed and extensive bilateral cerebellar infarction with patchy infarcts in the brainstem and supratentorial posterior circulation. 2. Swelling narrows the lower fourth ventricle without hydrocephalus. 3. Remote left MCA/watershed territory infarct. MRA: Re-established robust flow in the right vertebral and basilar arteries. Bilateral PCA luminal irregularity could be from recent thrombus or underlying atherosclerosis. Electronically Signed   By: Jorje Guild M.D.   On: 03/01/2022 05:02   MR BRAIN WO CONTRAST  Result Date: 03/01/2022 CLINICAL DATA:  Stroke follow-up EXAM: MRI HEAD WITHOUT CONTRAST MRA HEAD WITHOUT CONTRAST TECHNIQUE: Multiplanar, multi-echo pulse sequences of the brain and surrounding structures were acquired without intravenous contrast. Angiographic images of the Circle of Willis were acquired using MRA technique without intravenous contrast. COMPARISON:  CT and CTA from yesterday.  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 Bilateral midbrain and right para median pontine infarcts. Left pontine medullary junction infarct. Tiny acute infarcts in the bilateral thalamus and right occipital cortex. There is cerebellar swelling with crowding of the lower fourth  ventricle, but no hydrocephalus. Brain atrophy. Remote cortically based infarcts along the left cerebral convexity. Some petechial blood products at the level of cerebellar infarction. Some chronic blood products associated with prior infarcts. Vascular: Arterial findings below.  Preserved flow voids. Skull and upper cervical spine: Normal marrow signal Sinuses/Orbits: Extensive paranasal sinus opacification. MRA HEAD FINDINGS Anterior circulation: The carotid arteries and its branches are smoothly contoured and diffusely patent. Posterior circulation: Robust flow in the right vertebral and basilar arteries. Bilateral PCA heterogeneity which could be residual clot after recent aspiration, versus underlying atherosclerosis/vasculopathy. Left vertebral artery ends in the PICA, essentially stable from a 2012 intracranial MRA. IMPRESSION: Brain MRI: 1. Progressed and extensive bilateral cerebellar infarction with patchy infarcts in the brainstem and supratentorial posterior circulation. 2. Swelling narrows the lower fourth ventricle without hydrocephalus. 3. Remote left MCA/watershed territory infarct. MRA: Re-established robust flow in the right vertebral and basilar arteries. Bilateral PCA luminal irregularity could be from recent thrombus or underlying atherosclerosis. Electronically Signed   By: Jorje Guild M.D.   On: 03/01/2022 05:02   Portable Chest x-ray  Result Date: 03/03/2022 CLINICAL DATA:  ETT and OG tube placement EXAM: PORTABLE CHEST 1 VIEW COMPARISON:  03/07/2022 at 1727 hours FINDINGS:  Endotracheal tube terminates 3.5 cm above the carina. Mild patchy right mid/lower lung opacity, suspicious for aspiration/pneumonia, with small right pleural effusion. Left lung is clear. No pneumothorax. The heart is top-normal in size. Enteric tube terminates in the gastric cardia with its side port in the distal esophagus. Consider advancement 6 cm. IMPRESSION: Endotracheal tube terminates 3.5 cm above the  carina. Mild patchy right mid/lower lung opacity, suspicious for aspiration/pneumonia, with small right pleural effusion. Enteric tube terminates in the gastric cardia with side port in the distal esophagus. Consider advancement 6 cm. Electronically Signed   By: Julian Hy M.D.   On: 03/01/2022 23:20   CT ANGIO HEAD NECK W WO CM W PERF  Result Date: 02/25/2022 CLINICAL DATA:  Neuro deficit, acute, stroke suspected. Progressive left lesion and left gaze. Respiratory distress. EXAM: CT ANGIOGRAPHY HEAD AND NECK TECHNIQUE: Multidetector CT imaging of the head and neck was performed using the standard protocol during bolus administration of intravenous contrast. Multiplanar CT image reconstructions and MIPs were obtained to evaluate the vascular anatomy. Carotid stenosis measurements (when applicable) are obtained utilizing NASCET criteria, using the distal internal carotid diameter as the denominator. RADIATION DOSE REDUCTION: This exam was performed according to the departmental dose-optimization program which includes automated exposure control, adjustment of the mA and/or kV according to patient size and/or use of iterative reconstruction technique. CONTRAST:  141m OMNIPAQUE IOHEXOL 350 MG/ML SOLN COMPARISON:  MR head without contrast 03/08/2022. CTA head and neck 03/06/2022. FINDINGS: CT HEAD FINDINGS Brain: Remote left MCA encephalomalacia is stable. Remote left parietal lobe infarct is stable. Progressive hypoattenuation is present in the right cerebellum beyond the acute infarct noted on the previous MRI. Right occipital lobe infarct scratched at the evolving right occipital lobe infarct is stable. No acute right MCA territory infarct is present. Insular ribbon is normal. Basal ganglia is within normal limits. Mild generalized atrophy is otherwise stable. The ventricles are of normal size. No significant extraaxial fluid collection is present. Vascular: No hyperdense vessel or unexpected  calcification. Skull: Calvarium is intact. No focal lytic or blastic lesions are present. No significant extracranial soft tissue lesion is present. Sinuses/Orbits: Mild mucosal thickening present in the posterior ethmoid air cells. Moderate mucosal thickening is noted in the frontal sinuses. A remote left orbital blowout fracture is again noted. The paranasal sinuses and mastoid air cells are otherwise clear. Globes and orbits are otherwise within normal limits. Review of the MIP images confirms the above findings CTA NECK FINDINGS Aortic arch: Common origin of the left common carotid artery and innominate artery again noted. No significant stenosis is present the great vessel origins. Right carotid system: The right common carotid artery is within normal limits. Bifurcation is unremarkable. The cervical right ICA is within normal limits. Left carotid system: The left common carotid artery is within normal limits. Atherosclerotic changes are present at the proximal left ICA without a significant stenosis. Cervical left ICA is otherwise normal. Vertebral arteries: The dominant right vertebral artery is occluded at its origin. Hypoplastic left vertebral artery is stable. There is some reconstitution of the right vertebral artery at the C3 level. The V3 segment is within normal limits. Skeleton: Degenerative changes of the cervical spine are stable. Other neck: Soft tissues the neck are otherwise unremarkable. Salivary glands are within normal limits. Thyroid is normal. No significant adenopathy is present. No focal mucosal or submucosal lesions are present. Upper chest: The lung apices are clear. Thoracic inlet is within normal limits. Review of the MIP images  confirms the above findings CTA HEAD FINDINGS Anterior circulation: The internal carotid arteries are within normal limits through the high cervical segments. The A1 and M1 segments are normal. Anterior communicating artery patent. The MCA bifurcations are  normal bilaterally. The ACA and MCA branch vessels are within normal limits. Posterior circulation: The right vertebral artery becomes the basilar artery. The hypoplastic left vertebral artery terminates at the PICA. A progressive high-grade stenosis or occlusion is present in the mid basilar artery. Posterior cerebral arteries originate from the basilar tip. Proximal P2 segment occlusions are present. There is some collateral flow into the PCA territories. Venous sinuses: The dural sinuses are patent. The straight sinus and deep cerebral veins are intact. Cortical veins are within normal limits. No significant vascular malformation is evident. Anatomic variants: None Review of the MIP images confirms the above findings IMPRESSION: 1. 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 2. Proximal P2 segment occlusions bilaterally. 3. Progressive hypoattenuation in the right cerebellum beyond the acute infarct noted on the previous MRI. 4. Stable appearance of evolving right occipital lobe infarct. 5. Stable remote left MCA and left parietal lobe infarcts. 6. Stable occlusion of the dominant right vertebral artery at its origin with some reconstitution at the C3 level. 7. Hypoplastic left vertebral artery terminates at the PICA. 8. Stable mild atherosclerotic changes at the proximal left ICA without significant stenosis. Electronically Signed   By: San Morelle M.D.   On: 03/17/2022 19:39   DG CHEST PORT 1 VIEW  Result Date: 03/06/2022 CLINICAL DATA:  Aspiration EXAM: PORTABLE CHEST 1 VIEW COMPARISON:  Chest x-ray November 10, 2010 FINDINGS: Slightly widened mediastinal contours, likely positional. Normal heart size. Right basilar linear and heterogeneous pulmonary opacity. No left focal pulmonary opacity. No large pleural effusion or pneumothorax. No acute osseous abnormality. The visualized upper abdomen is unremarkable. IMPRESSION: Right basilar pulmonary opacity, possibly aspiration  or pneumonia. Electronically Signed   By: Beryle Flock M.D.   On: 02/27/2022 17:39   ECHOCARDIOGRAM COMPLETE  Result Date: 02/27/2022    ECHOCARDIOGRAM REPORT   Patient Name:   [name] Date of Exam: 02/27/2022 Medical Rec #:  [id number]       Height:       67.0 in Accession #:    [id number]      Weight:       206.3 lb Date of Birth:  05/14/1956       BSA:          2.049 m Patient Age:    89 years        BP:           118/69 mmHg Patient Gender: M               HR:           73 bpm. Exam Location:  Inpatient Procedure: 2D Echo, 3D Echo, Cardiac Doppler and Color Doppler Indications:     Stroke  History:         Patient has no prior history of Echocardiogram examinations.                  Stroke; Risk Factors:Current Smoker and Hypertension.  Sonographer:     Greer Pickerel Sonographer#2:   Bernadene Person RDCS Referring Phys:  Princella Ion A HENSEL Diagnosing Phys: Gwyndolyn Kaufman MD  Sonographer Comments: Image acquisition challenging due to respiratory motion. IMPRESSIONS  1. Left ventricular ejection fraction, by estimation, is 45 to 50%. Left ventricular  ejection fraction by 3D volume is 47 %. The left ventricle has mildly decreased function. The left ventricle demonstrates global hypokinesis. There is mild concentric left ventricular hypertrophy. Left ventricular diastolic parameters are consistent with Grade I diastolic dysfunction (impaired relaxation).  2. Right ventricular systolic function is normal. The right ventricular size is normal. Tricuspid regurgitation signal is inadequate for assessing PA pressure.  3. The mitral valve is grossly normal. Mild mitral valve regurgitation.  4. The aortic valve is tricuspid. Aortic valve regurgitation is not visualized. Aortic valve sclerosis is present, with no evidence of aortic valve stenosis.  5. The inferior vena cava is normal in size with <50% respiratory variability, suggesting right atrial pressure of 8 mmHg. Conclusion(s)/Recommendation(s): No  intracardiac source of embolism detected on this transthoracic study. Consider a transesophageal echocardiogram to exclude cardiac source of embolism if clinically indicated. FINDINGS  Left Ventricle: Left ventricular ejection fraction, by estimation, is 45 to 50%. Left ventricular ejection fraction by 3D volume is 47 %. The left ventricle has mildly decreased function. The left ventricle demonstrates global hypokinesis. The left ventricular internal cavity size was normal in size. There is mild concentric left ventricular hypertrophy. Left ventricular diastolic parameters are consistent with Grade I diastolic dysfunction (impaired relaxation). Right Ventricle: The right ventricular size is normal. No increase in right ventricular wall thickness. Right ventricular systolic function is normal. Tricuspid regurgitation signal is inadequate for assessing PA pressure. Left Atrium: Left atrial size was normal in size. Right Atrium: Right atrial size was normal in size. Pericardium: There is no evidence of pericardial effusion. Mitral Valve: The mitral valve is grossly normal. Mild mitral valve regurgitation. Tricuspid Valve: The tricuspid valve is normal in structure. Tricuspid valve regurgitation is trivial. Aortic Valve: The aortic valve is tricuspid. Aortic valve regurgitation is not visualized. Aortic valve sclerosis is present, with no evidence of aortic valve stenosis. Pulmonic Valve: The pulmonic valve was normal in structure. Pulmonic valve regurgitation is trivial. Aorta: The aortic root and ascending aorta are structurally normal, with no evidence of dilitation. Venous: The inferior vena cava is normal in size with less than 50% respiratory variability, suggesting right atrial pressure of 8 mmHg. IAS/Shunts: The atrial septum is grossly normal.  LEFT VENTRICLE PLAX 2D LVIDd:         4.20 cm         Diastology LVIDs:         2.90 cm         LV e' medial:    5.29 cm/s LV PW:         1.20 cm         LV E/e' medial:   15.7 LV IVS:        1.20 cm         LV e' lateral:   6.89 cm/s LVOT diam:     2.00 cm         LV E/e' lateral: 12.0 LV SV:         58 LV SV Index:   28 LVOT Area:     3.14 cm        3D Volume EF                                LV 3D EF:    Left  ventricul LV Volumes (MOD)                            ar LV vol d, MOD    95.2 ml                    ejection A2C:                                        fraction LV vol d, MOD    109.0 ml                   by 3D A4C:                                        volume is LV vol s, MOD    49.7 ml                    47 %. A2C: LV vol s, MOD    57.2 ml A4C:                           3D Volume EF: LV SV MOD A2C:   45.5 ml       3D EF:        47 % LV SV MOD A4C:   109.0 ml      LV EDV:       108 ml LV SV MOD BP:    48.4 ml       LV ESV:       57 ml                                LV SV:        51 ml RIGHT VENTRICLE RV S prime:     10.80 cm/s TAPSE (M-mode): 1.8 cm LEFT ATRIUM             Index        RIGHT ATRIUM           Index LA diam:        3.60 cm 1.76 cm/m   RA Area:     16.80 cm LA Vol (A2C):   55.0 ml 26.84 ml/m  RA Volume:   49.40 ml  24.11 ml/m LA Vol (A4C):   54.8 ml 26.74 ml/m LA Biplane Vol: 55.8 ml 27.23 ml/m  AORTIC VALVE LVOT Vmax:   91.70 cm/s LVOT Vmean:  61.300 cm/s LVOT VTI:    0.185 m  AORTA Ao Root diam: 3.60 cm Ao Asc diam:  3.40 cm MITRAL VALVE MV Area (PHT): 3.99 cm    SHUNTS MV Decel Time: 190 msec    Systemic VTI:  0.18 m MV E velocity: 82.80 cm/s  Systemic Diam: 2.00 cm Gwyndolyn Kaufman MD Electronically signed by Gwyndolyn Kaufman MD Signature Date/Time: 02/27/2022/12:32:09 PM    Final (Updated)    CT ANGIO HEAD NECK W WO CM  Result Date: 03/07/2022 CLINICAL DATA:  Stroke suspected. EXAM: CT ANGIOGRAPHY HEAD AND NECK TECHNIQUE: Multidetector CT imaging of the head and neck was performed using the standard protocol during bolus administration of intravenous contrast. Multiplanar CT  image reconstructions  and MIPs were obtained to evaluate the vascular anatomy. Carotid stenosis measurements (when applicable) are obtained utilizing NASCET criteria, using the distal internal carotid diameter as the denominator. RADIATION DOSE REDUCTION: This exam was performed according to the departmental dose-optimization program which includes automated exposure control, adjustment of the mA and/or kV according to patient size and/or use of iterative reconstruction technique. CONTRAST:  49m OMNIPAQUE IOHEXOL 350 MG/ML SOLN COMPARISON:  MRA 11/11/10 FINDINGS: CT HEAD FINDINGS Brain: Infarcts seen on same day MRI are not visualized on this exam. There is no evidence of hemorrhage. No extra-axial fluid collection. No hydrocephalus. Vascular: See below for vascular findings Skull: Normal. Negative for fracture or focal lesion. Sinuses/Orbits: There is polypoid mucosal thickening in the right maxillary sinus and an air-fluid level in the left maxillary sinus. There is a chronic fracture of the left lamina papyracea. There is also mucosal thickening involving the bilateral frontal sinuses. Other: There is extensive odontogenic disease with a soft tissue abscess along the left mandible measuring 1.5 x 0.3 cm (series 100, image 208). Review of the MIP images confirms the above findings CTA NECK FINDINGS Aortic arch: 2 vessel aortic arch. The proximal aspect of the branch vessels are poorly assessed due to streak artifact from contrast bolus in the subclavian vein imaged portion shows no evidence of aneurysm or dissection. No significant stenosis of the major arch vessel origins. Right carotid system: No evidence of dissection, stenosis (50% or greater), or occlusion. Left carotid system: No evidence of dissection, stenosis (50% or greater), or occlusion. Vertebral arteries: The right vertebral artery is occluded at the origin. There is intermittent opacification in the V2 segment. There is opacification at the V4 and V3 segments. The non  dominant left vertebral artery is contrast opacified to the V 3 segment. The V4 segment is small in caliber and may be intermittently occluded (series 11, image 148). 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 Other neck: Negative. Upper chest: Negative. Review of the MIP images confirms the above findings CTA HEAD FINDINGS Anterior circulation: No significant stenosis, proximal occlusion, aneurysm, or vascular malformation. Posterior circulation: P1 segments of bilateral PCAs are contrast opacified. There is non opacification of the P2 and distal PCA branches bilaterally. Venous sinuses: As permitted by contrast timing, patent. Anatomic variants: None Review of the MIP images confirms the above findings IMPRESSION: 1. 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 The V4 segment of the non dominant left vertebral artery is also likely intermittently occluded. Basilar artery and bilateral P1 segments are contrast opacified. 2. Non opacification of the P2 and distal PCA branches bilaterally. 3. 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 4. 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 Findings were discussed with Dr. CRonnald Nianon 03/20/2022 at 2:49 PM. Electronically Signed   By: HMarin RobertsM.D.   On: 03/05/2022 14:55   MR BRAIN WO CONTRAST  Result Date: 02/25/2022 CLINICAL DATA:  Stroke suspected.  Dizziness.  Fall yesterday. EXAM: MRI HEAD WITHOUT CONTRAST TECHNIQUE: Multiplanar, multiecho pulse sequences of the brain and surrounding structures were obtained without intravenous contrast. COMPARISON:  CT Neck 05/02/21 FINDINGS: Brain: There are multiple supra and infratentorial acute infarcts including in the right cerebellar hemisphere and right occipital lobe. This is likely  an additional recent  infarct in the left thalamus with mild hypointense signal on the ADC map (series 5, image 76). There are additional chronic appearing infarcts in the left occipital lobe (series 11, image 12), left frontal lobe, left parietal lobe, and the right cerebellum (series 11, image 6). No evidence of acute hemorrhage. No hydrocephalus. No extra-axial fluid collection., Vascular: Absent flow void in the V3 and V4 segments of the right vertebral artery, new from prior CT neck. Skull and upper cervical spine: Normal marrow signal. Sinuses/Orbits: Mucosal thickening bilateral maxillary, ethmoid, frontal sinuses. There is an air-fluid level in the left maxillary sinus, which can be seen in the setting of acute sinusitis; correlate with symptomatology. Other: None. IMPRESSION: 1. Posterior circulation acute infarcts in the right cerebellar hemisphere and right occipital lobe, as well as the left thalamus. Asymmetricall absent flow void in the V3 and V4 segments of the right vertebral artery is worrisome for occlusion and/or dissection, new from CT neck 05/02/21. Recommend further evaluation with CTA. No hemorrhage. 2. Additional chronic infarcts in the left occipital lobe, left frontal lobe, left parietal lobe, and the right cerebellum. 3. Paranasal sinus disease with an air-fluid level in the left maxillary sinus, which can be seen in the setting of acute sinusitis. These results will be called to the ordering clinician or representative by the Radiologist Assistant, and communication documented in the PACS or [employer]. Electronically Signed   By: Marin Roberts M.D.   On: 03/17/2022 14:00     PHYSICAL EXAM  Temp:  [96.9 F (36.1 C)-98.8 F (37.1 C)] 98.8 F (37.1 C) (10/08 0800) Pulse Rate:  [63-108] 77 (10/08 0715) Resp:  [10-20] 16 (10/08 0715) BP: (107-176)/(72-103) 145/87 (10/08 0715) SpO2:  [95 %-100 %] 98 % (10/08 0755) FiO2 (%):  [40 %-50 %] 40 % (10/08 0755)  General -  critically ill male, intubated Well nourished, well developed, sedated and intubated  Cardiovascular - Regular rhythm and rate.  Neuro -  Patient is intubated and sedated.  Eyes are closed.  He is unresponsive.Dysconjugate gaze with skew eye movements with left eye hyporophic and slightly outward., Pupils small and sluggish, some spontaneous eye movements.  Corneal reflexes are present bilaterally weak cough and gag.  Does not follow commands. localizing on left, no movement on right withdraws on left arm and leg withdraw on right leg but no movement on right arm    ASSESSMENT/PLAN [name] is a 66 y.o. male with history of stroke and smoker admitted for dizziness, imbalance.  Had a similar episode 1 month ago lasted only few minutes.  No tPA given due to outside window.    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 Proximal P2 segment occlusions bilaterally. 3. Progressive hypoattenuation in the right cerebellum beyond the acute infarct noted on the previous MRI. 4. Stable appearance of evolving right occipital lobe infarct. 5. Stable remote left MCA and left parietal lobe infarcts. 6. Stable occlusion of the  dominant right vertebral artery at its origin with some reconstitution at the C3 level. 7. 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 8. Stable mild atherosclerotic changes at the proximal left ICA without significant stenosis.   IR findings: with Dr. Kathi Ludwig 10/7 1.  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 2.  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  s/p revascularization of basilar artery, the posterior cerebral arteries and both anterior inferior cerebellar arteries, s/p angioplasty of the dominant right vertebral artery origin with a residual 50% stenosis. NPO  IVF  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 source with right VA and P2 occlusion s/p revascularization of basilar artery, the posterior cerebral arteries and both anterior inferior cerebellar arteries, s/p angioplasty of the dominant right vertebral artery origin with a residual 50% stenosis CT no acute abnormality MRI showed scattered right cerebellum and right PCA small infarcts CTA head and neck showed right MCA occlusion with V2/V3 reconstitution, left V4 intermittently occluded, bilateral P2 occlusion 2D Echo EF 45 to 50% LDL 15 HgbA1c 6.3 UDS negative Lovenox for VTE prophylaxis aspirin 325 mg daily prior to admission, now on aspirin 325 mg daily and clopidogrel 75 mg daily DAPT for 3 months and then Plavix alone given severe intracranial stenosis/occlusion Patient counseled to be compliant with his antithrombotic medications Ongoing aggressive stroke risk factor management Therapy recommendations: CIR Disposition: Pending  History of stroke 10/2010 admitted for right-sided weakness, MRI showed stroke at left MCA and bilateral ACA.  MRI showed left VA occlusion.  Discharged on aspirin  Hypertension Stable now Avoid low BP BP goal 120-140 for 24 hrs after IR On Cleviprex gtt attempt to wean Start losartan '25mg'$  daily today Long term BP goal  normotensive  Hyperlipidemia Home meds: Lipitor 20 LDL 15, goal < 70 Now decrease Lipitor to 10, no high intensity statin given low LDL Hold off now due to no po access Continue statin at discharge  Tobacco abuse Current smoker Smoking cessation counseling provided Nicotine patch provided Pt is willing to quit  Other Stroke Risk Factors Advanced age Obesity, Body mass index is 32.32 kg/m.   Other Active Problems Acute Respiratory Failure managed by CCM Dysphagia - hold off on feed for now due to vomiting. Consider starting tomorrow Vomiting Hypokalemia replace and recheck in am  Leukocytosis 13.1. Afebrile. Monitor  Anemia 12.5. Monitor  Hospital day # 3  Beulah Gandy DNP, ACNPC-AG  STROKE MD NOTE  I have personally obtained history,examined this patient, reviewed notes, independently viewed imaging studies, participated in medical decision making and plan of care.ROS completed by me personally and pertinent positives fully documented  I have made any additions or clarifications directly to the above note. Agree with note above.  Patient initially presented on 03/07/2022 with multi focal posterior circulation infarcts due to proximal right vertebral artery occlusion unfortunately had neurological worsening 2 days later and was found to have bilateral vertebral artery occlusion basilar artery occlusion and bilateral PCA occlusion yesterday requiring emergent mechanical thrombectomy with successful recanalization of the basilar, bilateral PCAs and angioplasty stenting of proximal right vertebral artery with residual 50% stenosis.  MRI scan of the brain repeated unfortunately shows progressive extensive bilateral cerebellar infarcts along with patchy infarcts involving the brainstem and supratentorial posterior circulation.  Recommend continue ventilatory support and strict blood pressure control with systolic goal between 440-347 for the first 24 hours as  per post thrombectomy protocol.   Wean sedation and see how patient does on exam.  Continue dual antiplatelet therapy of aspirin and Plavix and aggressive risk factor modification.  Prognosis is guarded given worsening findings on MRI.  Long discussion at the bedside with the patient's sister about his prognosis and discussion about evaluation and treatment plan and answered questions.  Discussed with Dr. Silas Flood critical care medicine.This patient is critically ill and at significant risk of neurological worsening, death and care requires constant monitoring of vital signs, hemodynamics,respiratory and cardiac monitoring, extensive review of multiple databases, frequent neurological assessment, discussion with family, other specialists and medical decision making of high complexity.I have made any additions or clarifications directly to the above note.This critical care time does not reflect procedure time, or teaching time or supervisory time of PA/NP/Med Resident etc but could involve care discussion time.  I spent 35 minutes of neurocritical care time  in the care of  this patient.      Antony Contras, MD Medical Director [facility] Pager: [phone] 03/01/2022 3:42 PM

## 2022-03-01 NOTE — Progress Notes (Signed)
1845: BP 141/79. Pt maxed on 21 of cleviprex. Dr. Rory Percy paged. New orders include: - one time dose labetalol - increase cleviprex ceiling to 32 - prn hydralazine  Night RN to call stroke MD if prn hydralazine needed.

## 2022-03-01 NOTE — Progress Notes (Signed)
OG tube length measured at 78 cm upon initial assessment of patient. Per chest xray, radiologist recommended advancing OG tube 6 cm. RN attempted to advance OG tube, and patient vomited. Zofran previously given. Oral and airway suction performed. Dr. Leonel Ramsay aware of patient's vomiting.

## 2022-03-01 NOTE — Anesthesia Postprocedure Evaluation (Signed)
Anesthesia Post Note  Patient: [name]  Procedure(s) Performed: IR WITH ANESTHESIA     Patient location during evaluation: ICU Anesthesia Type: General Level of consciousness: sedated Pain management: pain level controlled Vital Signs Assessment: post-procedure vital signs reviewed and stable Respiratory status: patient remains intubated per anesthesia plan Cardiovascular status: stable Postop Assessment: no apparent nausea or vomiting Anesthetic complications: no  No notable events documented.  Last Vitals:  Vitals:   03/07/2022 1113 03/24/2022 1612  BP: (!) 149/79 (!) 154/86  Pulse: 63 64  Resp: 18 16  Temp: 36.8 C 36.8 C  SpO2: 99%     Last Pain:  Vitals:   03/24/2022 1113  TempSrc: Oral  PainSc: 0-No pain                 [name]

## 2022-03-01 NOTE — Progress Notes (Signed)
Patient placed on PS 10/5 and tolerating well at this time. RT will monitor.

## 2022-03-01 NOTE — Progress Notes (Signed)
On-call note  Called by RN.  Blood pressure greater than 140.  Maxed out Cleviprex at 21  Recommended the following: - Cleviprex maximum range increased to 32 - Labetalol 10 mg every 2 hours as needed added-heart rate is in the 100s currently. - Next step would be to add hydralazine if needs more as needed's. - 2200 hrs. will be 24 hours from revascularization.  Consider liberalizing the upper limit of SBP to 160 at that time.  -- Amie Portland, MD Neurologist [facility] Pager: [phone]

## 2022-03-01 NOTE — Progress Notes (Signed)
[facility] Progress Note Patient Name: [name] DOB: 1955/11/15 MRN: [id number]   Date of Service  03/01/2022  HPI/Events of Note  Notified of tachycardia. HR 130-140s.  Pt has been weaned off cleviprex gtt.   Pt is intubated and off sedation.  Pt is afebrile.  Fentanyl gtt was restarted with HR remaining in the 120s.  Pt is getting maintenance fluids.    eICU Interventions  Give 500cc LR bolus.  Discontinue fentanyl gtt.  Pt remains unresponsive to noxious stimuli.      Intervention Category Intermediate Interventions: Arrhythmia - evaluation and management  Elsie Lincoln 03/01/2022, 11:55 PM  12:44 AM Pt received 500cc IVF bolus with improvement in HR in the 110s.  RN called again regarding hypotension with BP 74/51.  Plan> Start on neosynephrine gtt.

## 2022-03-01 NOTE — Consult Note (Signed)
NAME:  [name], MRN:  [id number], DOB:  [birth year], LOS: 3 ADMISSION DATE:  03/17/2022, CONSULTATION DATE:  10/7 REFERRING MD:  Dr. Estanislado Pandy, CHIEF COMPLAINT:  stroke  History of Present Illness:  Patient is a 66 year old male with pertinent PMH of CVA 2012, HTN, alcohol abuse, tobacco abuse, prediabetes presents to [facility] on 10/5 with dizziness.  Patient had stroke in 2012 affecting his left MCA, left PCA, right PCA.  Since previous stroke had RUE weakness and LU/LL weakness.  Patient states he has been having ongoing dizziness over the past 5 to 6 months but on 10/3 he had sudden onset dizziness and lightheadedness.  Symptoms did not improve and came to [facility] ED on 10/5 for further work-up.  Upon arrival to [facility] ED on 10/5, CTA showing 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  MRI showing posterior circulation acute infarcts in the right cerebellar hemisphere and right occipital lobe.  Neuro consulted.  Patient stable and admitted to floors.  Started on ASA, Plavix, statin.  Over the next few days patient stable and awaiting placement for CIR.  Later on 10/7 patient more lethargic and having left-sided weakness.  Also having left hemiplegia and left gaze.  MRI ordered.  When in MRI patient vomited and patient having some mild respiratory distress.  CXR showing RLL opacity likely aspiration.  Unable to obtain MRI.  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  Patient taken to IR for thrombectomy.  Patient going to ICU after procedure.  PCCM consulted.  Pertinent  Medical History   Past Medical History:  Diagnosis Date   Stroke ([place])      Significant Hospital Events: Including procedures, antibiotic start and stop dates in addition to other pertinent events   10/5: Admitted for stroke 10/7: Patient became more lethargic; basilar occlusion with penumbra; IR consulted and performed thrombectomy; intubated for procedure; PCCM consulted 10/8 off sedation,  flickers/withdraws to pain RUE and RLE, nothing on left, does not respond to sternal rub or voice  Interim History / Subjective:  Sedation stopped.  Withdrawals right upper and lower extremity to pain.  Does not respond to sternal rub or voice.  Nothing, no response to pain or noxious stimuli on the left.  Objective   Blood pressure (!) 137/90, pulse 83, temperature 99.2 F (37.3 C), temperature source Axillary, resp. rate 15, height '5\' 7"'$  (1.702 m), weight 93.6 kg, SpO2 98 %.    Vent Mode: PSV;CPAP FiO2 (%):  [40 %-50 %] 40 % Set Rate:  [16 bmp] 16 bmp Vt Set:  [530 mL] 530 mL PEEP:  [5 cmH20] 5 cmH20 Pressure Support:  [10 cmH20] 10 cmH20 Plateau Pressure:  [22 cmH20] 22 cmH20   Intake/Output Summary (Last 24 hours) at 03/01/2022 1456 Last data filed at 03/01/2022 1400 Gross per 24 hour  Intake 2825.31 ml  Output 2835 ml  Net -9.69 ml    Filed Weights   02/27/2022 2111  Weight: 93.6 kg    Examination: General: critically ill appearing on mech vent HEENT: MM pink/moist; ETT in place Neuro: Unresponsive, response to pain on the right, nothing on the left, does not respond to sternal rub or voice  CV: s1s2, RRR, no m/r/g PULM:  coarse rhonchi BS bilaterally; on mech vent PRVC GI: soft, bsx4 active  Extremities: warm/dry, no edema  Skin: no rashes or lesions appreciated   Resolved Hospital Problem list     Assessment & Plan:  CVA: 10/5 CTA showing occlusion of the  right vertebral artery at the origin.  MRI showing posterior circulation acute infarcts in the right cerebellar hemisphere and right occipital lobe. Basilar occlusion s/p thrombectomy 10/7 -neuro following; appreciate recs -Repeat imaging postprocedure per neuro IR -frequent neuro checks -Sedation discontinued -Cleviprex/Neo for SBP goal 120-140 -statin, DAPT per neuro -scds for dvt ppx -PT/OT/SLP when appropriate  Postop mechanical ventilation Possible aspiration pneumonia: RLL opacity -Unasyn for  aspiration -Follow-up tracheal aspirate -PRVC, pressure support trials as tolerated -Wean PEEP/FiO2 for SpO2 >92% -VAP bundle in place -Daily SAT and SBT -PAD protocol in place -wean sedation for RASS goal 0 to -1 -prn duoneb for wheezing  Hypokalemia -trend bmp and mag -replete electrolytes as needed  Elevated TSH: -T4 normal, suspect sick euthyroid  HTN -SBP goal as above -hold home losartan  Alcohol abuse Tobacco abuse -Thiamine, folic acid, MVI -Monitor for signs of withdrawal -Nicotine patch -Cessation counseling when appropriate  Dental abscess -Unasyn as above -Will need dental follow-up outpatient    Best Practice (right click and "Reselect all SmartList Selections" daily)   Diet/type: NPO w/ meds via tube DVT prophylaxis: SCD GI prophylaxis: PPI Lines: N/A Foley:  Yes, and it is still needed Code Status:  full code Last date of multidisciplinary goals of care discussion [per primary]  Labs   CBC: Recent Labs  Lab 03/11/2022 1145 03/15/2022 1254 02/27/22 0445 02/25/2022 2329 03/06/2022 2343 03/01/22 1000  WBC 5.0  --  4.4 4.6  --  13.1*  NEUTROABS 1.9  --   --  2.5  --   --   HGB 13.3 13.9 13.6 12.9* 11.9* 12.5*  HCT 40.2 41.0 38.8* 40.1 35.0* 37.1*  MCV 95.3  --  93.7 99.5  --  94.4  PLT 193  --  194 170  --  155     Basic Metabolic Panel: Recent Labs  Lab 02/23/2022 1145 03/10/2022 1254 02/27/22 0445 03/13/2022 2328 03/12/2022 2343 03/01/22 1000  NA 139 140 140 139 141 137  K 3.2* 3.5 3.4* 3.9 3.5 3.4*  CL 108 106 106 109  --  110  CO2 22  --  23 21*  --  22  GLUCOSE 142* 140* 108* 108*  --  149*  BUN 28* 32* 17 9  --  8  CREATININE 1.06 1.10 1.05 0.96  --  0.91  CALCIUM 9.6  --  10.1 8.6*  --  7.8*  MG  --   --   --  2.0  --  1.7  PHOS  --   --   --   --   --  2.9    GFR: Estimated Creatinine Clearance: 87.1 mL/min (by C-G formula based on SCr of 0.91 mg/dL). Recent Labs  Lab 03/16/2022 1145 02/27/22 0445 03/01/2022 2329 03/01/22 1000   WBC 5.0 4.4 4.6 13.1*     Liver Function Tests: No results for input(s): "AST", "ALT", "ALKPHOS", "BILITOT", "PROT", "ALBUMIN" in the last 168 hours. No results for input(s): "LIPASE", "AMYLASE" in the last 168 hours. No results for input(s): "AMMONIA" in the last 168 hours.  ABG    Component Value Date/Time   PHART 7.363 03/16/2022 2343   PCO2ART 39.2 03/04/2022 2343   PO2ART 95 02/25/2022 2343   HCO3 22.5 03/17/2022 2343   TCO2 24 03/05/2022 2343   ACIDBASEDEF 3.0 (H) 03/15/2022 2343   O2SAT 97 03/13/2022 2343     Coagulation Profile: No results for input(s): "INR", "PROTIME" in the last 168 hours.  Cardiac Enzymes: No results for input(s): "  CKTOTAL", "CKMB", "CKMBINDEX", "TROPONINI" in the last 168 hours.  HbA1C: Hgb A1c MFr Bld  Date/Time Value Ref Range Status  02/27/2022 04:45 AM 6.3 (H) 4.8 - 5.6 % Final    Comment:    (NOTE) Pre diabetes:          5.7%-6.4%  Diabetes:              >6.4%  Glycemic control for   <7.0% adults with diabetes   02/18/2021 11:11 AM 6.0 (H) 4.8 - 5.6 % Final    Comment:             Prediabetes: 5.7 - 6.4          Diabetes: >6.4          Glycemic control for adults with diabetes: <7.0     CBG: Recent Labs  Lab 03/10/2022 1730  GLUCAP 123*     Review of Systems:   Patient is encephalopathic and/or intubated. Therefore history has been obtained from chart review.    Past Medical History:  He,  has a past medical history of Stroke [facility]).   Surgical History:  History reviewed. No pertinent surgical history.   Social History:   reports that he has been smoking cigarettes. He has never used smokeless tobacco. He reports current alcohol use. He reports that he does not use drugs.   Family History:  His family history is not on file.   Allergies No Known Allergies   Home Medications  Prior to Admission medications   Medication Sig Start Date End Date Taking? Authorizing Provider  aspirin 325 MG tablet Take 325 mg  by mouth daily.     Yes [provider]  atorvastatin (LIPITOR) 20 MG tablet TAKE 1 TABLET(20 MG) BY MOUTH DAILY 08/14/21  Yes Ezequiel Essex, MD  carboxymethylcellulose (REFRESH PLUS) 0.5 % SOLN 1 drop daily as needed (dry eyes).   Yes [provider]  cetirizine (ZYRTEC) 10 MG tablet Take 1 tablet (10 mg total) by mouth daily. Patient taking differently: Take 10 mg by mouth daily as needed for allergies. 12/04/21  Yes Alcus Dad, MD  ibuprofen (ADVIL) 200 MG tablet Take 800 mg by mouth every 6 (six) hours as needed for mild pain.   Yes [provider]  losartan-hydrochlorothiazide (HYZAAR) 50-12.5 MG tablet TAKE 1 TABLET BY MOUTH DAILY 12/01/21  Yes Alcus Dad, MD  Multiple Vitamin (MULTIVITAMIN) capsule Take 1 capsule by mouth 2 (two) times a week.   Yes [provider]     Critical care time:    CRITICAL CARE Performed by: Lanier Clam   Total critical care time: 32 minutes  Critical care time was exclusive of separately billable procedures and treating other patients.  Critical care was necessary to treat or prevent imminent or life-threatening deterioration.  Critical care was time spent personally by me on the following activities: development of treatment plan with patient and/or surrogate as well as nursing, discussions with consultants, evaluation of patient's response to treatment, examination of patient, obtaining history from patient or surrogate, ordering and performing treatments and interventions, ordering and review of laboratory studies, ordering and review of radiographic studies, pulse oximetry and re-evaluation of patient's condition.   Lanier Clam, MD [facility] 03/01/2022, 2:56 PM  Please see [email or web address] for contact info From 7A-7P if no response, please call [phone]. After hours, please call ELink [phone].

## 2022-03-01 NOTE — Progress Notes (Signed)
Inpatient Rehab Admissions Coordinator:  Note pt with a change in status yesterday resulting in IR intervention. Pt currently on ventilator. Will continue to follow for medical work and participation and progress with therapies. Also attempted to contact pt's sister Letta Median again. No answer and not able to leave a message.    Gayland Curry, [place], [place] Admissions Coordinator [phone]

## 2022-03-01 NOTE — Progress Notes (Signed)
An USGPIV (ultrasound guided PIV) has been placed for short-term vasopressor infusion. A correctly placed ivWatch must be used when administering Vasopressors. Should this treatment be needed beyond 72 hours, central line access should be obtained.  It will be the responsibility of the bedside nurse to follow best practice to prevent extravasations.   ?

## 2022-03-02 ENCOUNTER — Encounter (HOSPITAL_COMMUNITY): Payer: Self-pay | Admitting: Radiology

## 2022-03-02 ENCOUNTER — Inpatient Hospital Stay (HOSPITAL_COMMUNITY): Payer: Medicare Other

## 2022-03-02 DIAGNOSIS — I651 Occlusion and stenosis of basilar artery: Secondary | ICD-10-CM | POA: Diagnosis not present

## 2022-03-02 LAB — CULTURE, RESPIRATORY W GRAM STAIN

## 2022-03-02 LAB — GLUCOSE, CAPILLARY: Glucose-Capillary: 115 mg/dL — ABNORMAL HIGH (ref 70–99)

## 2022-03-02 MED ORDER — SODIUM CHLORIDE 0.9 % IV SOLN: INTRAVENOUS | Status: DC

## 2022-03-02 MED ORDER — MORPHINE BOLUS VIA INFUSION: 5.0000 mg | INTRAVENOUS | Status: DC | PRN

## 2022-03-02 MED ORDER — ACETAMINOPHEN 325 MG PO TABS: 650.0000 mg | ORAL_TABLET | Freq: Four times a day (QID) | ORAL | Status: DC | PRN

## 2022-03-02 MED ORDER — GLYCOPYRROLATE 0.2 MG/ML IJ SOLN: 0.2000 mg | INTRAMUSCULAR | Status: DC | PRN

## 2022-03-02 MED ORDER — MANNITOL 25 % IV SOLN
INTRAVENOUS | Status: AC
  Filled 2022-03-02: qty 400

## 2022-03-02 MED ORDER — GLYCOPYRROLATE 1 MG PO TABS: 1.0000 mg | ORAL_TABLET | ORAL | Status: DC | PRN

## 2022-03-02 MED ORDER — MORPHINE 100MG IN NS 100ML (1MG/ML) PREMIX INFUSION
0.0000 mg/h | INTRAVENOUS | Status: DC
  Administered 2022-03-02: 5 mg/h via INTRAVENOUS
  Administered 2022-03-02: 10 mg/h via INTRAVENOUS
  Filled 2022-03-02: qty 100

## 2022-03-02 MED ORDER — MANNITOL 25 % IV SOLN
INTRAVENOUS | Status: AC
  Filled 2022-03-02: qty 50

## 2022-03-02 MED ORDER — PHENYLEPHRINE HCL-NACL 20-0.9 MG/250ML-% IV SOLN
0.0000 ug/min | INTRAVENOUS | Status: DC
  Administered 2022-03-02: 20 ug/min via INTRAVENOUS
  Filled 2022-03-02: qty 250

## 2022-03-02 MED ORDER — ACETAMINOPHEN 650 MG RE SUPP: 650.0000 mg | Freq: Four times a day (QID) | RECTAL | Status: DC | PRN

## 2022-03-02 MED ORDER — POLYVINYL ALCOHOL 1.4 % OP SOLN: 1.0000 [drp] | Freq: Four times a day (QID) | OPHTHALMIC | Status: DC | PRN

## 2022-03-02 MED ORDER — MANNITOL 25 % IV SOLN: 100.0000 g | Status: DC

## 2022-03-02 MED ORDER — MANNITOL 20 % IV SOLN
100.0000 g | Freq: Once | INTRAVENOUS | Status: AC
  Filled 2022-03-02: qty 500

## 2022-03-17 ENCOUNTER — Other Ambulatory Visit: Payer: Self-pay | Admitting: Family Medicine

## 2022-03-17 DIAGNOSIS — I10 Essential (primary) hypertension: Secondary | ICD-10-CM

## 2022-03-25 NOTE — Progress Notes (Signed)
Transported pt. Via vent to CT with no incident .

## 2022-03-25 NOTE — Death Summary Note (Addendum)
Death Summary    Patient ID: [name]   MRN: [id number]        DOB: 1955-12-23  Date of Admission: March 23, 2022 Date of Death: 03-27-22  Attending Physician:  Garvin Fila, MD Consultant(s):   Critical Care Medicine - Hunsucker, Bonna Gains, MD ; Family Medicine - Gerrit Heck, MD - Zenia Resides, MD  Patient's PCP:  Alcus Dad, MD   Cause of death: Respiratory failure due to multiple posterior circulation infarcts with large cerebellar infarcts with cytotoxic edema and brain herniation with obstruction of the fourth ventricle due to right vertebral artery occlusion with distal embolization to the basilar artery s/p mechanical thrombectomy of basilar artery, posterior cerebral arteries in both anterior inferior cerebral arteries with angioplasty of dominant right vertebral artery origin.  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 Consider starting tomorrow   Hypokalemia replace and recheck in am    Leukocytosis    Hyperlipidemia   Anemia of chronic disease   Past Medical History:  Diagnosis Date   Stroke [facility])    Past Surgical History:  Procedure Laterality Date   RADIOLOGY WITH ANESTHESIA N/A 03/16/2022   Procedure: IR WITH ANESTHESIA;  Surgeon: Radiologist, Medication, MD;  Location: [place];   Service: Radiology;  Laterality: N/A;    Family History No family history on file.  Social History  reports that he has been smoking cigarettes. He has never used smokeless tobacco. He reports current alcohol use. He reports that he does not use drugs.   LABORATORY STUDIES CBC    Component Value Date/Time   WBC 13.1 (H) 03/01/2022 1000   RBC 3.93 (L) 03/01/2022 1000   HGB 12.5 (L) 03/01/2022 1000   HCT 37.1 (L) 03/01/2022 1000   PLT 155 03/01/2022 1000   MCV 94.4 03/01/2022 1000   MCH 31.8 03/01/2022 1000   MCHC 33.7 03/01/2022 1000   RDW 16.1 (H) 03/01/2022 1000   LYMPHSABS 1.7 02/23/2022 2329   MONOABS 0.4 03/05/2022 2329   EOSABS 0.0 03/12/2022 2329   BASOSABS 0.0 02/27/2022 2329   CMP    Component Value Date/Time   NA 137 03/01/2022 1000   NA 142 11/04/2021 1423   K 3.4 (L) 03/01/2022 1000   CL 110 03/01/2022 1000   CO2 22 03/01/2022 1000   GLUCOSE 149 (H) 03/01/2022 1000   BUN 8 03/01/2022 1000   BUN 14 11/04/2021 1423   CREATININE 0.91 03/01/2022 1000   CALCIUM 7.8 (L) 03/01/2022 1000   PROT 7.8 02/18/2021 1111   ALBUMIN 4.3 02/18/2021 1111   AST 17 02/18/2021 1111   ALT 37 02/18/2021 1111   ALKPHOS 108 02/18/2021 1111   BILITOT 0.4 02/18/2021 1111   GFRNONAA >60 03/01/2022 1000   GFRAA >60 01/02/2016 1200   COAGS Lab Results  Component Value Date   INR 1.09 11/10/2010   Lipid Panel  Component Value Date/Time   CHOL 83 02/27/2022 0445   CHOL 102 02/18/2021 1111   TRIG 133 03/19/2022 2329   HDL 33 (L) 02/27/2022 0445   HDL 42 02/18/2021 1111   CHOLHDL 2.5 02/27/2022 0445   VLDL 35 02/27/2022 0445   LDLCALC 15 02/27/2022 0445   LDLCALC 39 02/18/2021 1111   HgbA1C  Lab Results  Component Value Date   HGBA1C 6.3 (H) 02/27/2022   Urinalysis    Component Value Date/Time   COLORURINE AMBER (A) 11/10/2010 1015   APPEARANCEUR CLEAR 11/10/2010 1015   LABSPEC 1.026 11/10/2010 1015   PHURINE 6.0 11/10/2010 1015   GLUCOSEU NEGATIVE  11/10/2010 1015   HGBUR NEGATIVE 11/10/2010 1015   BILIRUBINUR SMALL (A) 11/10/2010 1015   KETONESUR 40 (A) 11/10/2010 1015   PROTEINUR NEGATIVE 11/10/2010 1015   UROBILINOGEN 1.0 11/10/2010 1015   NITRITE NEGATIVE 11/10/2010 1015   LEUKOCYTESUR SMALL (A) 11/10/2010 1015   Urine Drug Screen     Component Value Date/Time   LABOPIA NONE DETECTED 03/21/2022 2257   COCAINSCRNUR NONE DETECTED 02/24/2022 2257   LABBENZ NONE DETECTED 03/16/2022 2257   AMPHETMU NONE DETECTED 03/01/2022 2257   THCU NONE DETECTED 02/27/2022 2257   LABBARB NONE DETECTED 03/21/2022 2257    Alcohol Level No results found for: "ETH"   SIGNIFICANT DIAGNOSTIC STUDIES CT HEAD WO CONTRAST (5MM)  Result Date: 2022-03-18 CLINICAL DATA:  Initial evaluation for mental status change, unknown cause. EXAM: CT HEAD WITHOUT CONTRAST TECHNIQUE: Contiguous axial images were obtained from the base of the skull through the vertex without intravenous contrast. RADIATION DOSE REDUCTION: This exam was performed according to the departmental dose-optimization program which includes automated exposure control, adjustment of the mA and/or kV according to patient size and/or use of iterative reconstruction technique. COMPARISON:  Prior MRI from 03/01/2022. FINDINGS: Brain: There has been interval evolution of previously identified cerebellar infarcts, with extensive cytotoxic edema now seen within the right greater than left cerebellum. Involvement of the adjacent pons/brainstem, grossly similar. Progressive swelling throughout the posterior fossa of, with complete effacement of the fourth ventricle now seen. Superimposed linear hyperdensity at the paramedian left cerebellum suspicious for a superimposed small hemorrhage, likely a Duret type bleed (series 3, image 6). Increased lateral and third ventriculomegaly with evidence of transependymal flow of CSF, consistent with hydrocephalus. Additionally, the cerebellar tonsils are low lying at the  foramen magnum as compared to prior, consistent with early herniation. No other new large vessel territory infarct. No acute intracranial hemorrhage. Chronic left PCA territory infarct noted. No midline shift or extra-axial fluid collection. Vascular: No abnormal hyperdense vessel. Skull: Scalp soft tissues and calvarium demonstrate no new finding. Sinuses/Orbits: Globes orbital soft tissues demonstrate no acute finding. Scattered mucosal thickening noted throughout the paranasal sinuses. Mastoid air cells remain largely clear. Other: None. IMPRESSION: 1. Interval evolution of previously identified cerebellar infarcts, with extensive cytotoxic edema now seen within the right greater than left cerebellum. Progressive swelling throughout the posterior fossa with complete effacement of the fourth ventricle. Increased lateral and third ventriculomegaly with transependymal flow of CSF, consistent with hydrocephalus. Additionally, the cerebellar tonsils are low lying at the foramen magnum, consistent with early herniation. 2. Superimposed small linear hyperdensity at the inferior left cerebellum, consistent with an associated small Duret type hemorrhage. Critical Value/emergent results were called by telephone at the time of interpretation on 03/18/2022 at 3:34 am to provider [facility] , who verbally acknowledged these results. Electronically Signed   By: Pincus Badder.D.  On: 03/15/2022 03:45   MR ANGIO HEAD WO CONTRAST  Result Date: 03/01/2022 CLINICAL DATA:  Stroke follow-up EXAM: MRI HEAD WITHOUT CONTRAST MRA HEAD WITHOUT CONTRAST TECHNIQUE: Multiplanar, multi-echo pulse sequences of the brain and surrounding structures were acquired without intravenous contrast. Angiographic images of the Circle of Willis were acquired using MRA technique without intravenous contrast. COMPARISON:  CT and CTA from yesterday.  Brain MRI from 2 days ago FINDINGS: MRI HEAD FINDINGS Brain: Extensive bilateral  cerebellar infarction which is progressed. Bilateral midbrain and right para median pontine infarcts. Left pontine medullary junction infarct. Tiny acute infarcts in the bilateral thalamus and right occipital cortex. There is cerebellar swelling with crowding of the lower fourth ventricle, but no hydrocephalus. Brain atrophy. Remote cortically based infarcts along the left cerebral convexity. Some petechial blood products at the level of cerebellar infarction. Some chronic blood products associated with prior infarcts. Vascular: Arterial findings below.  Preserved flow voids. Skull and upper cervical spine: Normal marrow signal Sinuses/Orbits: Extensive paranasal sinus opacification. MRA HEAD FINDINGS Anterior circulation: The carotid arteries and its branches are smoothly contoured and diffusely patent. Posterior circulation: Robust flow in the right vertebral and basilar arteries. Bilateral PCA heterogeneity which could be residual clot after recent aspiration, versus underlying atherosclerosis/vasculopathy. Left vertebral artery ends in the PICA, essentially stable from a 2012 intracranial MRA. IMPRESSION: Brain MRI: 1. Progressed and extensive bilateral cerebellar infarction with patchy infarcts in the brainstem and supratentorial posterior circulation. 2. Swelling narrows the lower fourth ventricle without hydrocephalus. 3. Remote left MCA/watershed territory infarct. MRA: Re-established robust flow in the right vertebral and basilar arteries. Bilateral PCA luminal irregularity could be from recent thrombus or underlying atherosclerosis. Electronically Signed   By: Jorje Guild M.D.   On: 03/01/2022 05:02   MR BRAIN WO CONTRAST  Result Date: 03/01/2022 CLINICAL DATA:  Stroke follow-up EXAM: MRI HEAD WITHOUT CONTRAST MRA HEAD WITHOUT CONTRAST TECHNIQUE: Multiplanar, multi-echo pulse sequences of the brain and surrounding structures were acquired without intravenous contrast. Angiographic images of the  Circle of Willis were acquired using MRA technique without intravenous contrast. COMPARISON:  CT and CTA from yesterday.  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 Bilateral midbrain and right para median pontine infarcts. Left pontine medullary junction infarct. Tiny acute infarcts in the bilateral thalamus and right occipital cortex. There is cerebellar swelling with crowding of the lower fourth ventricle, but no hydrocephalus. Brain atrophy. Remote cortically based infarcts along the left cerebral convexity. Some petechial blood products at the level of cerebellar infarction. Some chronic blood products associated with prior infarcts. Vascular: Arterial findings below.  Preserved flow voids. Skull and upper cervical spine: Normal marrow signal Sinuses/Orbits: Extensive paranasal sinus opacification. MRA HEAD FINDINGS Anterior circulation: The carotid arteries and its branches are smoothly contoured and diffusely patent. Posterior circulation: Robust flow in the right vertebral and basilar arteries. Bilateral PCA heterogeneity which could be residual clot after recent aspiration, versus underlying atherosclerosis/vasculopathy. Left vertebral artery ends in the PICA, essentially stable from a 2012 intracranial MRA. IMPRESSION: Brain MRI: 1. Progressed and extensive bilateral cerebellar infarction with patchy infarcts in the brainstem and supratentorial posterior circulation. 2. Swelling narrows the lower fourth ventricle without hydrocephalus. 3. Remote left MCA/watershed territory infarct. MRA: Re-established robust flow in the right vertebral and basilar arteries. Bilateral PCA luminal irregularity could be from recent thrombus or underlying atherosclerosis. Electronically Signed   By: Jorje Guild M.D.   On: 03/01/2022 05:02  Portable Chest x-ray  Result Date: 03/24/2022 CLINICAL DATA:  ETT and OG tube placement EXAM:  PORTABLE CHEST 1 VIEW COMPARISON:  03/06/2022 at 1727 hours FINDINGS: Endotracheal tube terminates 3.5 cm above the carina. Mild patchy right mid/lower lung opacity, suspicious for aspiration/pneumonia, with small right pleural effusion. Left lung is clear. No pneumothorax. The heart is top-normal in size. Enteric tube terminates in the gastric cardia with its side port in the distal esophagus. Consider advancement 6 cm. IMPRESSION: Endotracheal tube terminates 3.5 cm above the carina. Mild patchy right mid/lower lung opacity, suspicious for aspiration/pneumonia, with small right pleural effusion. Enteric tube terminates in the gastric cardia with side port in the distal esophagus. Consider advancement 6 cm. Electronically Signed   By: Julian Hy M.D.   On: 03/24/2022 23:20   CT ANGIO HEAD NECK W WO CM W PERF  Result Date: 03/13/2022 CLINICAL DATA:  Neuro deficit, acute, stroke suspected. Progressive left lesion and left gaze. Respiratory distress. EXAM: CT ANGIOGRAPHY HEAD AND NECK TECHNIQUE: Multidetector CT imaging of the head and neck was performed using the standard protocol during bolus administration of intravenous contrast. Multiplanar CT image reconstructions and MIPs were obtained to evaluate the vascular anatomy. Carotid stenosis measurements (when applicable) are obtained utilizing NASCET criteria, using the distal internal carotid diameter as the denominator. RADIATION DOSE REDUCTION: This exam was performed according to the departmental dose-optimization program which includes automated exposure control, adjustment of the mA and/or kV according to patient size and/or use of iterative reconstruction technique. CONTRAST:  111m OMNIPAQUE IOHEXOL 350 MG/ML SOLN COMPARISON:  MR head without contrast 03/14/2022. CTA head and neck 02/22/2022. FINDINGS: CT HEAD FINDINGS Brain: Remote left MCA encephalomalacia is stable. Remote left parietal lobe infarct is stable. Progressive hypoattenuation is  present in the right cerebellum beyond the acute infarct noted on the previous MRI. Right occipital lobe infarct scratched at the evolving right occipital lobe infarct is stable. No acute right MCA territory infarct is present. Insular ribbon is normal. Basal ganglia is within normal limits. Mild generalized atrophy is otherwise stable. The ventricles are of normal size. No significant extraaxial fluid collection is present. Vascular: No hyperdense vessel or unexpected calcification. Skull: Calvarium is intact. No focal lytic or blastic lesions are present. No significant extracranial soft tissue lesion is present. Sinuses/Orbits: Mild mucosal thickening present in the posterior ethmoid air cells. Moderate mucosal thickening is noted in the frontal sinuses. A remote left orbital blowout fracture is again noted. The paranasal sinuses and mastoid air cells are otherwise clear. Globes and orbits are otherwise within normal limits. Review of the MIP images confirms the above findings CTA NECK FINDINGS Aortic arch: Common origin of the left common carotid artery and innominate artery again noted. No significant stenosis is present the great vessel origins. Right carotid system: The right common carotid artery is within normal limits. Bifurcation is unremarkable. The cervical right ICA is within normal limits. Left carotid system: The left common carotid artery is within normal limits. Atherosclerotic changes are present at the proximal left ICA without a significant stenosis. Cervical left ICA is otherwise normal. Vertebral arteries: The dominant right vertebral artery is occluded at its origin. Hypoplastic left vertebral artery is stable. There is some reconstitution of the right vertebral artery at the C3 level. The V3 segment is within normal limits. Skeleton: Degenerative changes of the cervical spine are stable. Other neck: Soft tissues the neck are otherwise unremarkable. Salivary glands are within normal limits.  Thyroid is normal. No significant adenopathy  is present. No focal mucosal or submucosal lesions are present. Upper chest: The lung apices are clear. Thoracic inlet is within normal limits. Review of the MIP images confirms the above findings CTA HEAD FINDINGS Anterior circulation: The internal carotid arteries are within normal limits through the high cervical segments. The A1 and M1 segments are normal. Anterior communicating artery patent. The MCA bifurcations are normal bilaterally. The ACA and MCA branch vessels are within normal limits. Posterior circulation: The right vertebral artery becomes the basilar artery. The hypoplastic left vertebral artery terminates at the PICA. A progressive high-grade stenosis or occlusion is present in the mid basilar artery. Posterior cerebral arteries originate from the basilar tip. Proximal P2 segment occlusions are present. There is some collateral flow into the PCA territories. Venous sinuses: The dural sinuses are patent. The straight sinus and deep cerebral veins are intact. Cortical veins are within normal limits. No significant vascular malformation is evident. Anatomic variants: None Review of the MIP images confirms the above findings IMPRESSION: 1. 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 2. Proximal P2 segment occlusions bilaterally. 3. Progressive hypoattenuation in the right cerebellum beyond the acute infarct noted on the previous MRI. 4. Stable appearance of evolving right occipital lobe infarct. 5. Stable remote left MCA and left parietal lobe infarcts. 6. Stable occlusion of the dominant right vertebral artery at its origin with some reconstitution at the C3 level. 7. Hypoplastic left vertebral artery terminates at the PICA. 8. Stable mild atherosclerotic changes at the proximal left ICA without significant stenosis. Electronically Signed   By: San Morelle M.D.   On: 03/06/2022 19:39   DG CHEST PORT 1 VIEW  Result  Date: 03/18/2022 CLINICAL DATA:  Aspiration EXAM: PORTABLE CHEST 1 VIEW COMPARISON:  Chest x-ray November 10, 2010 FINDINGS: Slightly widened mediastinal contours, likely positional. Normal heart size. Right basilar linear and heterogeneous pulmonary opacity. No left focal pulmonary opacity. No large pleural effusion or pneumothorax. No acute osseous abnormality. The visualized upper abdomen is unremarkable. IMPRESSION: Right basilar pulmonary opacity, possibly aspiration or pneumonia. Electronically Signed   By: Beryle Flock M.D.   On: 03/18/2022 17:39   ECHOCARDIOGRAM COMPLETE  Result Date: 02/27/2022    ECHOCARDIOGRAM REPORT   Patient Name:   [name] Date of Exam: 02/27/2022 Medical Rec #:  [id number]       Height:       67.0 in Accession #:    [id number]      Weight:       206.3 lb Date of Birth:  [birth year]       BSA:          2.049 m Patient Age:    59 years        BP:           118/69 mmHg Patient Gender: M               HR:           73 bpm. Exam Location:  Inpatient Procedure: 2D Echo, 3D Echo, Cardiac Doppler and Color Doppler Indications:     Stroke  History:         Patient has no prior history of Echocardiogram examinations.                  Stroke; Risk Factors:Current Smoker and Hypertension.  Sonographer:     Greer Pickerel Sonographer#2:   Bernadene Person RDCS Referring Phys:  [place] HENSEL Diagnosing Phys: Nira Conn  Johney Frame MD  Sonographer Comments: Image acquisition challenging due to respiratory motion. IMPRESSIONS  1. Left ventricular ejection fraction, by estimation, is 45 to 50%. Left ventricular ejection fraction by 3D volume is 47 %. The left ventricle has mildly decreased function. The left ventricle demonstrates global hypokinesis. There is mild concentric left ventricular hypertrophy. Left ventricular diastolic parameters are consistent with Grade I diastolic dysfunction (impaired relaxation).  2. Right ventricular systolic function is normal. The right ventricular size is  normal. Tricuspid regurgitation signal is inadequate for assessing PA pressure.  3. The mitral valve is grossly normal. Mild mitral valve regurgitation.  4. The aortic valve is tricuspid. Aortic valve regurgitation is not visualized. Aortic valve sclerosis is present, with no evidence of aortic valve stenosis.  5. The inferior vena cava is normal in size with <50% respiratory variability, suggesting right atrial pressure of 8 mmHg. Conclusion(s)/Recommendation(s): No intracardiac source of embolism detected on this transthoracic study. Consider a transesophageal echocardiogram to exclude cardiac source of embolism if clinically indicated. FINDINGS  Left Ventricle: Left ventricular ejection fraction, by estimation, is 45 to 50%. Left ventricular ejection fraction by 3D volume is 47 %. The left ventricle has mildly decreased function. The left ventricle demonstrates global hypokinesis. The left ventricular internal cavity size was normal in size. There is mild concentric left ventricular hypertrophy. Left ventricular diastolic parameters are consistent with Grade I diastolic dysfunction (impaired relaxation). Right Ventricle: The right ventricular size is normal. No increase in right ventricular wall thickness. Right ventricular systolic function is normal. Tricuspid regurgitation signal is inadequate for assessing PA pressure. Left Atrium: Left atrial size was normal in size. Right Atrium: Right atrial size was normal in size. Pericardium: There is no evidence of pericardial effusion. Mitral Valve: The mitral valve is grossly normal. Mild mitral valve regurgitation. Tricuspid Valve: The tricuspid valve is normal in structure. Tricuspid valve regurgitation is trivial. Aortic Valve: The aortic valve is tricuspid. Aortic valve regurgitation is not visualized. Aortic valve sclerosis is present, with no evidence of aortic valve stenosis. Pulmonic Valve: The pulmonic valve was normal in structure. Pulmonic valve  regurgitation is trivial. Aorta: The aortic root and ascending aorta are structurally normal, with no evidence of dilitation. Venous: The inferior vena cava is normal in size with less than 50% respiratory variability, suggesting right atrial pressure of 8 mmHg. IAS/Shunts: The atrial septum is grossly normal.  LEFT VENTRICLE PLAX 2D LVIDd:         4.20 cm         Diastology LVIDs:         2.90 cm         LV e' medial:    5.29 cm/s LV PW:         1.20 cm         LV E/e' medial:  15.7 LV IVS:        1.20 cm         LV e' lateral:   6.89 cm/s LVOT diam:     2.00 cm         LV E/e' lateral: 12.0 LV SV:         58 LV SV Index:   28 LVOT Area:     3.14 cm        3D Volume EF                                LV 3D EF:  Left                                             ventricul LV Volumes (MOD)                            ar LV vol d, MOD    95.2 ml                    ejection A2C:                                        fraction LV vol d, MOD    109.0 ml                   by 3D A4C:                                        volume is LV vol s, MOD    49.7 ml                    47 %. A2C: LV vol s, MOD    57.2 ml A4C:                           3D Volume EF: LV SV MOD A2C:   45.5 ml       3D EF:        47 % LV SV MOD A4C:   109.0 ml      LV EDV:       108 ml LV SV MOD BP:    48.4 ml       LV ESV:       57 ml                                LV SV:        51 ml RIGHT VENTRICLE RV S prime:     10.80 cm/s TAPSE (M-mode): 1.8 cm LEFT ATRIUM             Index        RIGHT ATRIUM           Index LA diam:        3.60 cm 1.76 cm/m   RA Area:     16.80 cm LA Vol (A2C):   55.0 ml 26.84 ml/m  RA Volume:   49.40 ml  24.11 ml/m LA Vol (A4C):   54.8 ml 26.74 ml/m LA Biplane Vol: 55.8 ml 27.23 ml/m  AORTIC VALVE LVOT Vmax:   91.70 cm/s LVOT Vmean:  61.300 cm/s LVOT VTI:    0.185 m  AORTA Ao Root diam: 3.60 cm Ao Asc diam:  3.40 cm MITRAL VALVE MV Area (PHT): 3.99 cm    SHUNTS MV Decel Time: 190 msec    Systemic VTI:  0.18 m MV E  velocity: 82.80 cm/s  Systemic Diam: 2.00 cm Gwyndolyn Kaufman MD Electronically signed by Gwyndolyn Kaufman MD Signature Date/Time: 02/27/2022/12:32:09 PM    Final (Updated)  CT ANGIO HEAD NECK W WO CM  Result Date: 02/27/2022 CLINICAL DATA:  Stroke suspected. EXAM: CT ANGIOGRAPHY HEAD AND NECK TECHNIQUE: Multidetector CT imaging of the head and neck was performed using the standard protocol during bolus administration of intravenous contrast. Multiplanar CT image reconstructions and MIPs were obtained to evaluate the vascular anatomy. Carotid stenosis measurements (when applicable) are obtained utilizing NASCET criteria, using the distal internal carotid diameter as the denominator. RADIATION DOSE REDUCTION: This exam was performed according to the departmental dose-optimization program which includes automated exposure control, adjustment of the mA and/or kV according to patient size and/or use of iterative reconstruction technique. CONTRAST:  66m OMNIPAQUE IOHEXOL 350 MG/ML SOLN COMPARISON:  MRA 11/11/10 FINDINGS: CT HEAD FINDINGS Brain: Infarcts seen on same day MRI are not visualized on this exam. There is no evidence of hemorrhage. No extra-axial fluid collection. No hydrocephalus. Vascular: See below for vascular findings Skull: Normal. Negative for fracture or focal lesion. Sinuses/Orbits: There is polypoid mucosal thickening in the right maxillary sinus and an air-fluid level in the left maxillary sinus. There is a chronic fracture of the left lamina papyracea. There is also mucosal thickening involving the bilateral frontal sinuses. Other: There is extensive odontogenic disease with a soft tissue abscess along the left mandible measuring 1.5 x 0.3 cm (series 100, image 208). Review of the MIP images confirms the above findings CTA NECK FINDINGS Aortic arch: 2 vessel aortic arch. The proximal aspect of the branch vessels are poorly assessed due to streak artifact from contrast bolus in the subclavian  vein imaged portion shows no evidence of aneurysm or dissection. No significant stenosis of the major arch vessel origins. Right carotid system: No evidence of dissection, stenosis (50% or greater), or occlusion. Left carotid system: No evidence of dissection, stenosis (50% or greater), or occlusion. Vertebral arteries: The right vertebral artery is occluded at the origin. There is intermittent opacification in the V2 segment. There is opacification at the V4 and V3 segments. The non dominant left vertebral artery is contrast opacified to the V 3 segment. The V4 segment is small in caliber and may be intermittently occluded (series 11, image 148). 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 Other neck: Negative. Upper chest: Negative. Review of the MIP images confirms the above findings CTA HEAD FINDINGS Anterior circulation: No significant stenosis, proximal occlusion, aneurysm, or vascular malformation. Posterior circulation: P1 segments of bilateral PCAs are contrast opacified. There is non opacification of the P2 and distal PCA branches bilaterally. Venous sinuses: As permitted by contrast timing, patent. Anatomic variants: None Review of the MIP images confirms the above findings IMPRESSION: 1. 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 The V4 segment of the non dominant left vertebral artery is also likely intermittently occluded. Basilar artery and bilateral P1 segments are contrast opacified. 2. Non opacification of the P2 and distal PCA branches bilaterally. 3. 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 4. 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 Findings were discussed with Dr. CRonnald Nianon 03/19/2022 at 2:49 PM. Electronically Signed   By: HMarin RobertsM.D.   On:  03/10/2022 14:55   MR BRAIN WO CONTRAST  Result Date: 03/12/2022 CLINICAL DATA:  Stroke suspected.  Dizziness.  Fall yesterday. EXAM: MRI HEAD WITHOUT CONTRAST TECHNIQUE: Multiplanar, multiecho  pulse sequences of the brain and surrounding structures were obtained without intravenous contrast. COMPARISON:  CT Neck 05/02/21 FINDINGS: Brain: There are multiple supra and infratentorial acute infarcts including in the right cerebellar hemisphere and right occipital lobe. This is likely an additional recent infarct in the left thalamus with mild hypointense signal on the ADC map (series 5, image 76). There are additional chronic appearing infarcts in the left occipital lobe (series 11, image 12), left frontal lobe, left parietal lobe, and the right cerebellum (series 11, image 6). No evidence of acute hemorrhage. No hydrocephalus. No extra-axial fluid collection., Vascular: Absent flow void in the V3 and V4 segments of the right vertebral artery, new from prior CT neck. Skull and upper cervical spine: Normal marrow signal. Sinuses/Orbits: Mucosal thickening bilateral maxillary, ethmoid, frontal sinuses. There is an air-fluid level in the left maxillary sinus, which can be seen in the setting of acute sinusitis; correlate with symptomatology. Other: None. IMPRESSION: 1. Posterior circulation acute infarcts in the right cerebellar hemisphere and right occipital lobe, as well as the left thalamus. Asymmetricall absent flow void in the V3 and V4 segments of the right vertebral artery is worrisome for occlusion and/or dissection, new from CT neck 05/02/21. Recommend further evaluation with CTA. No hemorrhage. 2. Additional chronic infarcts in the left occipital lobe, left frontal lobe, left parietal lobe, and the right cerebellum. 3. Paranasal sinus disease with an air-fluid level in the left maxillary sinus, which can be seen in the setting of acute sinusitis. These results will be called to the ordering clinician or  representative by the Radiologist Assistant, and communication documented in the PACS or [employer]. Electronically Signed   By: Marin Roberts M.D.   On: 03/03/2022 14:00       HISTORY OF PRESENT ILLNESS (From consult note Amie Portland, MD 03/19/2022) [name] is a 66 y.o. male prior history of stroke with residual right upper extremity weakness and clumsiness, and left upper and lower extremity weakness and clumsiness (2012 neurology note from Dr. Tyron Russell  reports multiple strokes at that time and left PCA, left MCA and right PCA territory-presented to the emergency room for evaluation of sudden onset of dizziness.  He [name] has been having ongoing dizziness for 5 to 6 months but it was 2 days ago when he woke up from sleep that he felt extremely dizzy and lightheaded along with feeling imbalanced or off balance and felt that his equilibrium was off.  Symptoms did not improve and that made him come to the hospital.  An MRI was done that revealed cerebellar infarctions for which neurological consultation was obtained. He reports that he has had prior strokes, the big one is 2012 after which he had difficulty with using his right arm and his left foot has been numb with some left-sided weakness.  Upon asking him if he had any residual left facial droop, he said that he sees that off-and-on and it has been present since 2012. 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 MRI brain with multiple acute/subacute infarcts in the posterior left temporal lobe, left occipital lobe, left caudate, left insular cortex, left frontal lobe, left parietal lobe along with right parietal lobe. LKW: At least 2 days ago IV thrombolysis given?: no, outside window Premorbid modified Rankin scale (mRS): 2  HOSPITAL COURSE [name] is a 66 y.o. male with history of stroke and smoker admitted for dizziness, imbalance.  Had a similar episode  1 month  ago lasted only few minutes.  No tPA given due to outside window.     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 Proximal P2 segment occlusions bilaterally. 3. Progressive hypoattenuation in the right cerebellum beyond the acute infarct noted on the previous MRI. 4. Stable appearance of evolving right occipital lobe infarct. 5. Stable remote left MCA and left parietal lobe infarcts. 6. Stable occlusion of the dominant right vertebral artery at its origin with some reconstitution at the C3 level. 7. Hypoplastic left vertebral artery terminates at the PICA. 8. Stable mild atherosclerotic changes at the proximal left ICA without significant stenosis.   CT Head WO Contrast 03-24-2022 03:45 1. Interval evolution of previously identified cerebellar infarcts, with extensive cytotoxic edema now seen within the right greater than left cerebellum. Progressive swelling throughout the posterior fossa with complete effacement of the fourth ventricle. Increased lateral and third ventriculomegaly with transependymal flow of CSF, consistent with hydrocephalus. Additionally, the cerebellar tonsils are low lying at the foramen magnum, consistent with early herniation. 2. Superimposed small linear hyperdensity at the  inferior left cerebellum, consistent with an associated small Duret type hemorrhage.   IR findings: with Dr. Kathi Ludwig 03/21/2022 1.  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 2.  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  s/p revascularization of basilar artery, the posterior cerebral arteries and both anterior inferior cerebellar arteries, s/p angioplasty of the dominant right vertebral artery origin with a residual 50% stenosis. NPO  IVF   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 source with right VA and P2 occlusion s/p revascularization of basilar artery, the posterior cerebral arteries and both anterior inferior cerebellar arteries, s/p angioplasty of the dominant right vertebral artery origin with a residual 50% stenosis CT no acute abnormality MRI showed scattered right cerebellum and right PCA small infarcts CTA head and neck showed right MCA occlusion with V2/V3 reconstitution, left V4 intermittently occluded, bilateral P2 occlusion 2D Echo EF 45 to 50% LDL 15 HgbA1c 6.3 UDS negative Lovenox for VTE prophylaxis Aspirin 325 mg daily prior to admission, now on aspirin 325 mg daily and clopidogrel 75 mg daily DAPT for 3 months and then Plavix alone given severe intracranial stenosis/occlusion Patient counseled to be compliant with his antithrombotic medications Ongoing aggressive stroke risk factor management Therapy recommendations: CIR Disposition: Pending   History of stroke 10/2010 admitted for right-sided weakness, MRI showed stroke at left MCA and bilateral ACA.  MRI showed left VA occlusion.  Discharged on aspirin   Hypertension Stable now Avoid low BP BP goal 120-140 for 24 hrs after IR On Cleviprex gtt attempt to wean Start losartan 4m daily today Long term BP goal normotensive   Hyperlipidemia Home meds:  Lipitor 20 LDL 15, goal < 70 Decrease Lipitor to 10 mg,  no high intensity statin given low LDL Hold off now due to no po access Continue statin at discharge   Tobacco abuse Current smoker Smoking cessation counseling provided Nicotine patch provided Pt is willing to quit   Other Stroke Risk Factors Advanced age Obesity, Body mass index is 32.32 kg/m.    Other Active Problems Acute Respiratory Failure managed by CCM Dysphagia - hold off on feed for now due to vomiting. Consider starting tomorrow Vomiting Hypokalemia replace and recheck in am  Leukocytosis 13.1. Afebrile. Monitor  Anemia 12.5. Monitor   SUMMARY Dr Leonel Ramsay was called to see the patient at about 2:00 AM on 03-08-2022 regarding worsening of the patient's neuro exam. The patient no longer had any response to pain. Pupils were 4 mm on left and very sluggish, 37m on right and fixed. 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 He had no response to noxious stimulation.  A CT of the Head W/O Contrast at 1:50 AM revealed extensive edema in the posterior fossa with anterior compression of the brainstem.  The patient did not have central access, so Dr KLeonel Ramsaygave mannitol 93.75g ( 1g/kg) to try to temporize him during discussions with family members regarding goals of care.  Dr KLeonel Ramsaysuspected that even with aggressive therapy(e.g. surgery), he would be severely debilitated. He discussed this with the patient's mother who indicated that he would not want to continue in a severely debilitated state. Given this, it was felt that CPR was likewise unlikely to benefit him if he were to have a cardiac arrest. This was discussed with his mother who expressed understanding.  The family planned come in to visit, so supportive care was continued with plans to transition to comfort care later that day. Dr KLeonel Ramsaymet with the family around 6:00 AM. After the visit orders were placed to withdraw care. At 6:58 AM the patient was extubated by  respiratory therapy in keeping with Dr KCecil Cobbsorders. The family was present for the procedure. The time of death was 7:20 AM.   45 minutes were spent preparing discharge.  DMikey BussingPA-C [facility] Pager [phone]/15/2023 1:07 PM   I have personally obtained history,examined this patient, reviewed notes, independently viewed imaging studies, participated in medical decision making and plan of care.ROS completed by me personally and pertinent positives fully documented  I have made any additions or clarifications directly to the above note. Agree with note above.    PAntony Contras MD Medical Director [facility] Pager: [phone] 15, 20235:10 PM

## 2022-03-25 NOTE — Progress Notes (Signed)
Extubated pt. Per md order per family request to comfort care. Family in room

## 2022-03-25 NOTE — Progress Notes (Signed)
Called regarding worsening neuro exam. HE no longer has any response to pain. Pupils are 4 mm on left and very sluggish, 63m on right and fixed. 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 He has no response to noxious stimulation.   Head CT reveals extensive edema in the posterior fossa with anterior compression of the brainstem. He has   He does not have central access, so gave mannitol 93.75g ( 1g/kg) to try to temporize him as we had discussions regarding goals of care.   I suspect that even with aggressive therapy()e.g. surgery), he will be severely debilitated. I discussed this with his mother who indicates that he would not want to continue in a severely debilitated state. Given this, I think that CPR is likewise unlikely to benefit him if he were to have a cardiac arrest. I discussed this with his mother who expressed understanding.   Family will be coming to visit, so for now will continue supportive care but suspect we will transition to comfort care later today.   This patient is critically ill and at significant risk of neurological worsening, death and care requires constant monitoring of vital signs, hemodynamics,respiratory and cardiac monitoring, neurological assessment, discussion with family, other specialists and medical decision making of high complexity. I spent 55 minutes of neurocritical care time  in the care of  this patient. This was time spent independent of any time provided by nurse practitioner or PA.  MRoland Rack MD [facility] [phone] If 7pm- 7am, please page neurology on call as listed in [place] 124-Oct-2023 3:01 AM

## 2022-03-25 NOTE — Progress Notes (Signed)
Family has arrived and ready to proceed with comfort care. Will place orders.   Roland Rack, MD [facility] [phone]  If 7pm- 7am, please page neurology on call as listed in [place].

## 2022-03-25 DEATH — deceased

## 2023-06-13 IMAGING — DX DG LUMBAR SPINE COMPLETE 4+V
5 series · 5 of 5 positions shown · non-contrast
Comparison: None.

CLINICAL DATA: Low back pain midline.  No injury.

EXAM:
LUMBAR SPINE - COMPLETE 4+ VIEW

[dg lumbar spine complete 4 +v (1 of 5)]
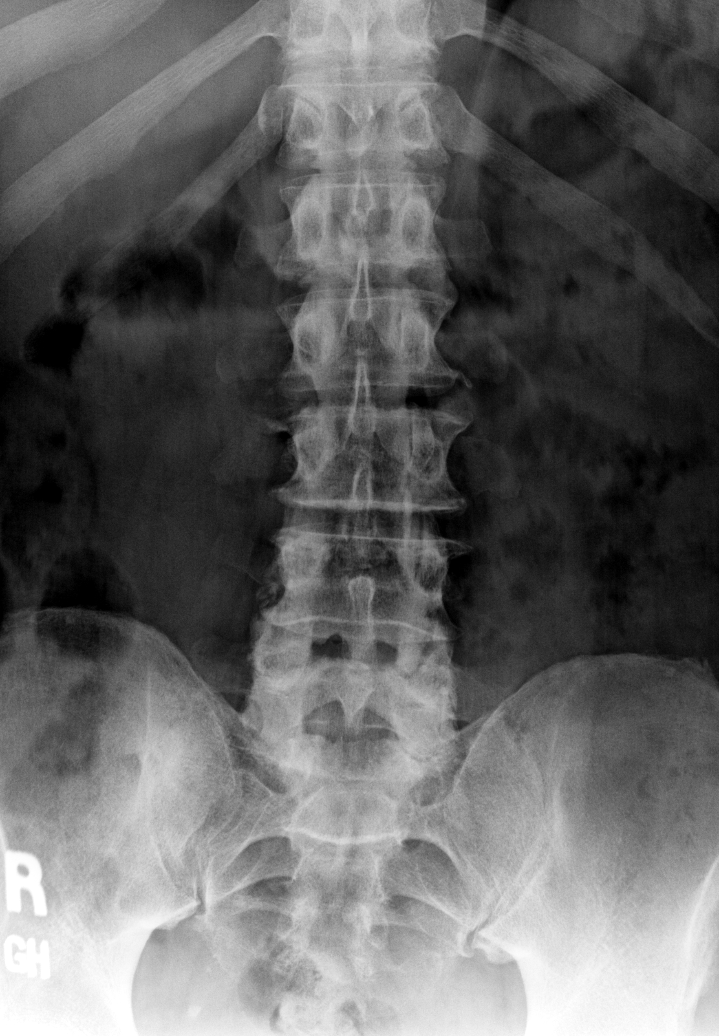

[dg lumbar spine complete 4 +v (2 of 5)]
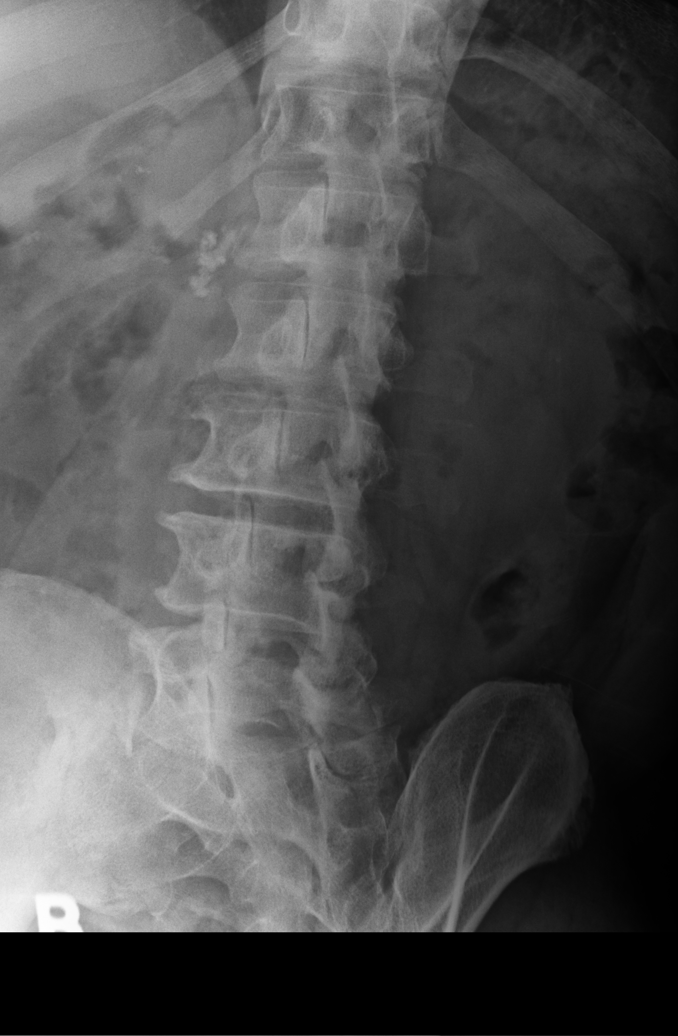

[dg lumbar spine complete 4 +v (3 of 5)]
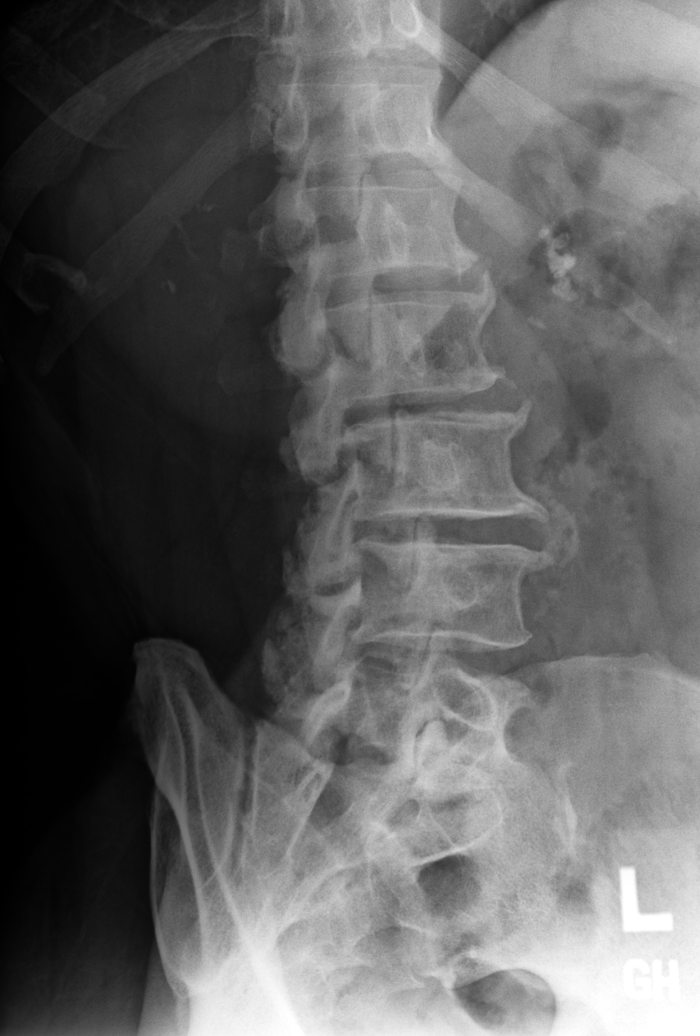

[dg lumbar spine complete 4 +v (4 of 5)]
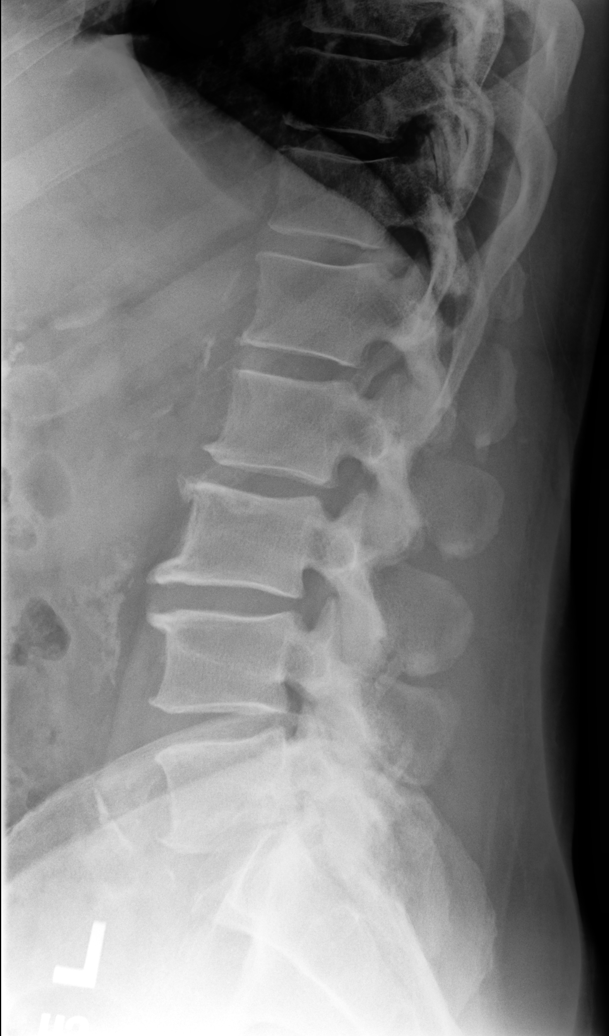

[dg lumbar spine complete 4 +v (5 of 5)]
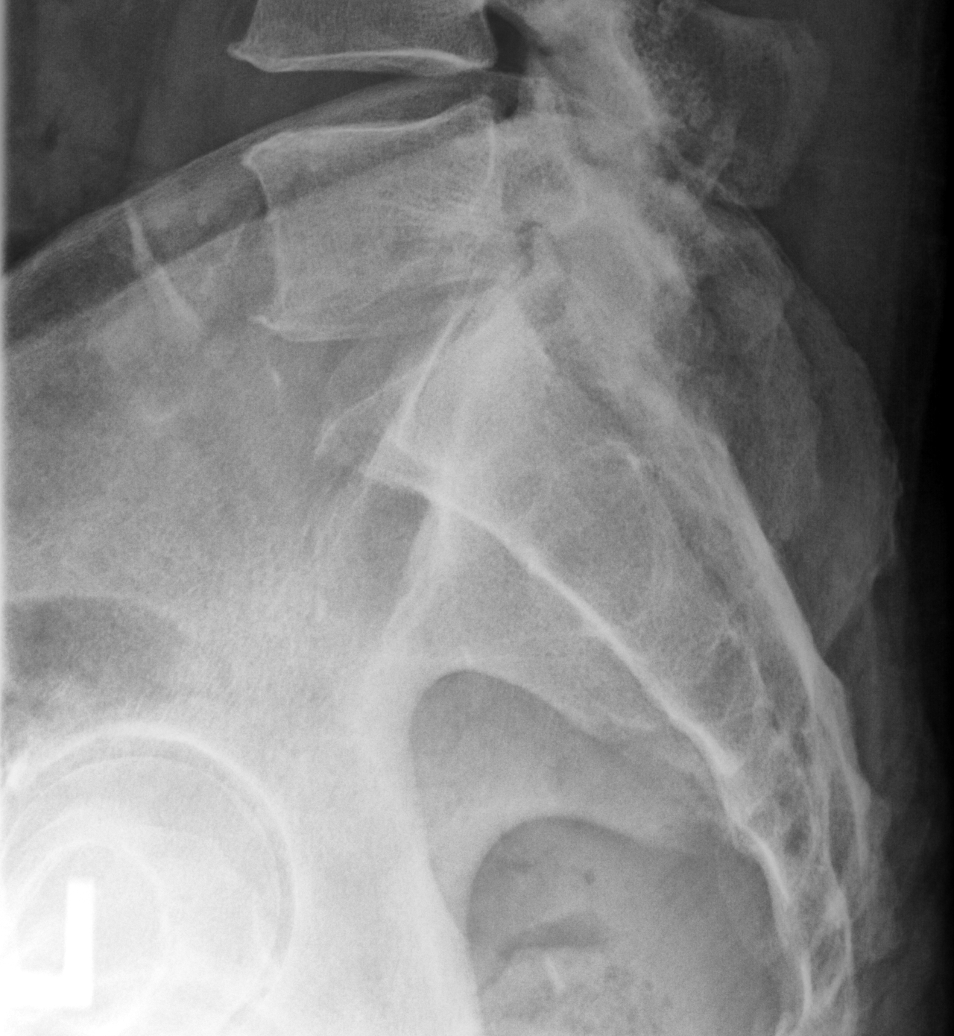

[5 of 5 positions shown; findings below may reference images not displayed]

FINDINGS: Vertebral body alignment and heights are normal. There is mild to
moderate spondylosis of the lumbar spine to include facet
arthropathy over the mid to lower lumbar spine. No evidence of
compression fracture or spondylolisthesis. Minimal disc space
narrowing from the L2-3 level to the L4-5 level. Nonspecific
calcifications over the upper abdomen bilaterally.
IMPRESSION: 1. No acute findings.
2. Mild to moderate spondylosis of the lumbar spine with mild
multilevel disc disease.
